# Patient Record
Sex: Female | Born: 1955 | Race: White | Hispanic: No | Marital: Married | Smoking: Never smoker
Health system: Southern US, Community
[De-identification: ages and names within clinical notes are randomized; demographics above are authoritative.]

## PROBLEM LIST (undated history)

## (undated) DIAGNOSIS — R5383 Other fatigue: Secondary | ICD-10-CM

## (undated) HISTORY — DX: Other fatigue: R53.83

---

## 2012-10-12 DIAGNOSIS — Z87898 Personal history of other specified conditions: Secondary | ICD-10-CM | POA: Insufficient documentation

## 2014-09-11 ENCOUNTER — Ambulatory Visit (HOSPITAL_COMMUNITY): Payer: BLUE CROSS/BLUE SHIELD | Admitting: Psychiatry

## 2014-11-28 ENCOUNTER — Encounter (HOSPITAL_COMMUNITY): Payer: Self-pay | Admitting: Psychiatry

## 2014-11-28 ENCOUNTER — Ambulatory Visit (INDEPENDENT_AMBULATORY_CARE_PROVIDER_SITE_OTHER): Payer: BLUE CROSS/BLUE SHIELD | Admitting: Psychiatry

## 2014-11-28 VITALS — BP 110/66 | HR 66 | Ht 65.5 in | Wt 201.0 lb

## 2014-11-28 DIAGNOSIS — F411 Generalized anxiety disorder: Secondary | ICD-10-CM

## 2014-11-28 DIAGNOSIS — F331 Major depressive disorder, recurrent, moderate: Secondary | ICD-10-CM | POA: Diagnosis not present

## 2014-11-28 DIAGNOSIS — M797 Fibromyalgia: Secondary | ICD-10-CM | POA: Insufficient documentation

## 2014-11-28 DIAGNOSIS — M545 Low back pain, unspecified: Secondary | ICD-10-CM | POA: Insufficient documentation

## 2014-11-28 DIAGNOSIS — G4700 Insomnia, unspecified: Secondary | ICD-10-CM

## 2014-11-28 MED ORDER — ESCITALOPRAM OXALATE 10 MG PO TABS: 10.0000 mg | ORAL_TABLET | Freq: Every day | ORAL | Status: DC

## 2014-11-28 MED ORDER — TRAZODONE HCL 50 MG PO TABS: 50.0000 mg | ORAL_TABLET | Freq: Every day | ORAL | Status: DC

## 2014-11-28 NOTE — Progress Notes (Signed)
Psychiatric Initial Adult Assessment   Patient Identification: Danielle Boyd MRN:  [id number] Date of Evaluation:  11/28/2014 Referral Source: Self and Dr. Alton Revere (Neurology) Chief Complaint:   Chief Complaint    Establish Care     Visit Diagnosis:    ICD-9-CM ICD-10-CM   1. Fibromyalgia 729.1 M79.7   2. Moderate episode of recurrent major depressive disorder (HCC) 296.32 F33.1   3. GAD (generalized anxiety disorder) 300.02 F41.1   4. Insomnia 780.52 G47.00    Diagnosis:   Patient Active Problem List   Diagnosis Date Noted  . Fibromyalgia [M79.7] 11/28/2014  . Low back pain [M54.5] 11/28/2014  . 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  Patient says that she is suffering from depression for the last 6-7 years since her granddaughter was born. She has having difficulty in her developmental milestones. Patient says before that she has been doing reasonable and what take trips with her husband and be happy. Patient endorses decreased motivation decreased interest crying spells. Currently she is on disability because of her fibromyalgia. Patient also endorses excessive worry, unreasonable at times difficulty sleeping muscle tightening at night restless legs.  Aggravating factors; some developmental delays of her granddaughter. Medical complexity including fibromyalgia and restless legs. At times finances Modifying factors; her supportive husband and her daughter. Medical complexity; high blood pressure, chronic back pain. Fibromyalgia  Location; depression and anxiety, insomnia She is having difficulty sleeping maintaining sleep she goes to bed at 9 and has difficult falling asleep and her mind starts worrying. She has been on Ativan which has been gradually increased to 4 mg. She also takes melatonin at times she is  also on high dose of gabapentin and Inderal Severity; 4 out of 10. 10 being no depression Context; her granddaughter Duration; 7 years  (Hypo) Manic Symptoms:  Distractibility, Anxiety Symptoms:  Excessive Worry, Psychotic Symptoms:  denies PTSD Symptoms: NA   Past Psychiatric History: No prior psychiatric admissions. No prior psychiatric suicide attempt. She has not seen a psychiatrist. She has not been on an antidepressant. Since then years ago her dad died and she was put on some medication she said that she did not take it for long  Past Medical History: No past medical history on file. No past surgical history on file. Family History: No family history on file. Social History:   Social History   Social History  . Marital Status: Married    Spouse Name: N/A  . Number of Children: N/A  . Years of Education: N/A   Social History Main Topics  . Smoking status: Not on file  . Smokeless tobacco: Not on file  . Alcohol Use: Not on file  . Drug Use: Not on file  . Sexual Activity: Not on file   Other Topics Concern  . Not on file   Social History Narrative  . No narrative on file   Additional Social History: She grew up with her parents. Says growing up was good no trauma. She had a good childhood. She has worked in office settings for many years currently she is on disability because of fibromyalgia. She is married has a supportive husband she has one grown daughter. No legal issue  Musculoskeletal: Strength & Muscle Tone: within normal limits Gait & Station: normal Patient leans: N/A  Psychiatric Specialty Exam: HPI  Review of Systems  Constitutional:  Positive for malaise/fatigue. Negative for fever.  Cardiovascular: Negative for chest pain and palpitations.  Musculoskeletal: Positive for myalgias.  Neurological: Negative for tremors.  Psychiatric/Behavioral: Positive for depression. Negative for suicidal ideas and substance abuse. The patient is nervous/anxious and  has insomnia.     Blood pressure 110/66, pulse 66, height 5' 5.5" (1.664 m), weight 201 lb (91.173 kg), SpO2 94 %.Body mass index is 32.93 kg/(m^2).  General Appearance: Casual  Eye Contact:  Fair  Speech:  Slow  Volume:  Decreased  Mood:  Dysphoric  Affect:  Congruent, Constricted and Depressed  Thought Process:  Coherent  Orientation:  Full (Time, Place, and Person)  Thought Content:  Rumination  Suicidal Thoughts:  No  Homicidal Thoughts:  No  Memory:  Immediate;   Fair Recent;   Fair  Judgement:  Fair  Insight:  Shallow  Psychomotor Activity:  Decreased  Concentration:  Fair  Recall:  [employer]  Fund of Knowledge:Fair  Language: Fair  Akathisia:  Negative  Handed:  Right  AIMS (if indicated):    Assets:  Desire for Improvement  ADL's:  Intact  Cognition: WNL  Sleep:  poor   Is the patient at risk to self?  No. Has the patient been a risk to self in the past 6 months?  No. Has the patient been a risk to self within the distant past?  No. Is the patient a risk to others?  No. Has the patient been a risk to others in the past 6 months?  No. Has the patient been a risk to others within the distant past?  No.  Allergies:   Allergies  Allergen Reactions  . Nsaids     Other reaction(s): Other (See Comments) GI upset   Current Medications: Current Outpatient Prescriptions  Medication Sig Dispense Refill  . escitalopram (LEXAPRO) 10 MG tablet Take 1 tablet (10 mg total) by mouth daily. Take half tablet for first 5 days and then one tablet a day. 30 tablet 0  . gabapentin (NEURONTIN) 400 MG capsule TAKE ONE CAPSULE BY MOUTH IN THE MORNING, AND 3 OR 4 AT BEDTIME  6  . HYDROcodone-acetaminophen (NORCO) 10-325 MG tablet     . LORazepam (ATIVAN) 2 MG tablet Frequency:   Dosage:0.0     Instructions:  Note:    . LORazepam (ATIVAN) 2 MG tablet TAKE 1 TO 2 TABLETS AT BEDTIME  2  . MIMVEY 1-0.5 MG tablet Take 1 tablet by mouth daily.  11  . NEUPRO 2 MG/24HR APPLY 1 PATCH DAILY AS  DIRECTED.  4  . ondansetron (ZOFRAN) 4 MG tablet TAKE 1 TABLET EVERY 8 HOURS AS NEEDED FOR NAUSEA  0  . propranolol ER (INDERAL LA) 160 MG SR capsule Take 160 mg by mouth daily.  5  . simvastatin (ZOCOR) 20 MG tablet Take 20 mg by mouth daily.  3  . traZODone (DESYREL) 50 MG tablet Take 1 tablet (50 mg total) by mouth at bedtime. 30 tablet 0   No current facility-administered medications for this visit.    Previous Psychotropic Medications: No   Substance Abuse History in the last 12 months:  No.  Consequences of Substance Abuse: NA  Medical Decision Making:  Review of Psycho-Social Stressors (1), Decision to obtain old records (1), Review of Medication Regimen & Side Effects (2) and Review of New Medication or Change in Dosage (2)  Treatment Plan Summary: Medication management and Plan as follows   MDD: Start Lexapro 5 mg increase to 10 mg in  one week,. Recommend to cut down inderal LA for headaches. Generalized anxiety disorder; start Lexapro as above Insomnia; may be related with high doses of Ativan and other pain medications. She will have to start working slowly on cutting down melatonin, cut down Ativan. Discussed and reviewed sleep hygiene Medical complexity; including fibromyalgia. She will work with her primary care physician and neurologist to work down on lowering the medications which may be sedating or if contributing to depression. She understands medication to take time and will need adjustment from our side and patients sleep habits. She is also advised to avoid any alcohol. Recommend psychotherapy. More than 50% time spent in counseling and coordination came to inpatient education, sleep review patient education and review of side effects Call 911 or report local emergency room for any urgent concerns of suicidal thoughts  Follow-up in 3-4 weeks or earlier if needed  Jeorgia Helming 11/15/20169:43 AM

## 2014-11-28 NOTE — Patient Instructions (Signed)
Lower and consider to DC ativan at night Lower the dose of inderal as it can cause depression Lower neuroniting or any sedating medications slowly  Avoid caffeine Go to bed late around 10;30 pm and avoid naps or recliner during evening. I will start trazadone at night and lexapro for depression and anxiety

## 2014-12-04 ENCOUNTER — Telehealth (HOSPITAL_COMMUNITY): Payer: Self-pay | Admitting: *Deleted

## 2014-12-04 NOTE — Telephone Encounter (Signed)
Pt would like to speak with Dr. Gilmore LarocheAkhtar. Pt states the medication is too strong for her. Pt states she is having walking since she started taking Lexapro and Trazodone. Please call to advise at [phone].

## 2014-12-04 NOTE — Telephone Encounter (Signed)
Per Dr. Gilmore LarocheAkhtar, please inform pt to decrease dosage of Lexapro from 10mg  to 5mg  and Trazodone from 50mg  to 25mg . If pt is still experiencing symptoms, please contact the office for an earlier appt. Pt verbalizes understanding.

## 2014-12-11 NOTE — Telephone Encounter (Signed)
Return telephone call to pt. Pt states she is having body aches and stop taking the medication. Pt states she would like to try a different medication. Informed pt medication changes are not done over the telephone. Offered to schedule an appt, pt is schedule for a visit on 12/14/14.

## 2014-12-11 NOTE — Telephone Encounter (Signed)
[name] [phone]  Danielle Boyd called to say that she had quit taking her medicine and she cry ed all day Saturday and she needs to come in or something please call her to advise.

## 2014-12-14 ENCOUNTER — Ambulatory Visit (INDEPENDENT_AMBULATORY_CARE_PROVIDER_SITE_OTHER): Payer: BLUE CROSS/BLUE SHIELD | Admitting: Psychiatry

## 2014-12-14 ENCOUNTER — Encounter (HOSPITAL_COMMUNITY): Payer: Self-pay | Admitting: Psychiatry

## 2014-12-14 VITALS — BP 114/62 | HR 62 | Ht 65.5 in | Wt 199.0 lb

## 2014-12-14 DIAGNOSIS — F331 Major depressive disorder, recurrent, moderate: Secondary | ICD-10-CM | POA: Diagnosis not present

## 2014-12-14 DIAGNOSIS — G4700 Insomnia, unspecified: Secondary | ICD-10-CM | POA: Diagnosis not present

## 2014-12-14 DIAGNOSIS — F411 Generalized anxiety disorder: Secondary | ICD-10-CM

## 2014-12-14 DIAGNOSIS — M797 Fibromyalgia: Secondary | ICD-10-CM

## 2014-12-14 MED ORDER — BUPROPION HCL 75 MG PO TABS: 75.0000 mg | ORAL_TABLET | Freq: Every morning | ORAL | Status: DC

## 2014-12-14 NOTE — Progress Notes (Signed)
Patient ID: Danielle Boyd, female   DOB: 01-Jul-1955, 59 y.o.   MRN: [id number]  Psychiatric [facility] Outpatient Visit  Patient Identification: Danielle Boyd MRN:  [id number] Date of Evaluation:  12/14/2014 Referral Source: Self and Dr. Alton Revere (Neurology) Chief Complaint:   Chief Complaint    Medication Problem     Visit Diagnosis:    ICD-9-CM ICD-10-CM   1. Fibromyalgia 729.1 M79.7   2. Moderate episode of recurrent major depressive disorder (HCC) 296.32 F33.1   3. GAD (generalized anxiety disorder) 300.02 F41.1   4. Insomnia 780.52 G47.00    Diagnosis:   Patient Active Problem List   Diagnosis Date Noted  . Fibromyalgia [M79.7] 11/28/2014  . Low back pain [M54.5] 11/28/2014  . 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  Initially presented with depression. Crying spells and thinking about her granddaughter. Last visit we added Lexapro. She had some aches and pain or radiation of pain and she stopped it. Continues to endorse anxiety complicated by having difficulty sleeping high tolerance of pain medication and fibromyalgia  Aggravating factors; some developmental delays of her granddaughter. Medical complexity including fibromyalgia and restless legs. At times finances Modifying factors; her supportive husband and her daughter. Medical complexity; high blood pressure, chronic back pain. Fibromyalgia  Location; depression and anxiety, insomnia She is having difficulty sleeping maintaining sleep she goes to bed at 9 and has difficult falling asleep and her mind starts worrying. She has been on Ativan which has been gradually increased to 4 mg. She also takes melatonin at times she is also on high dose of gabapentin and Inderal Severity; 4 out of 10. 10 being no depression Context; her granddaughter Duration; 7  years  (Hypo) Manic Symptoms:  Distractibility, Anxiety Symptoms:  Excessive Worry, Psychotic Symptoms:  denies PTSD Symptoms: NA     Past Medical History:  Past Medical History  Diagnosis Date  . Fatigue    No past surgical history on file. Family History: No family history on file. Social History:   Social History   Social History  . Marital Status: Married    Spouse Name: N/A  . Number of Children: N/A  . Years of Education: N/A   Social History Main Topics  . Smoking status: Never Smoker   . Smokeless tobacco: Never Used  . Alcohol Use: 0.6 oz/week    1 Cans of beer per week  . Drug Use: No  . Sexual Activity: Not Currently   Other Topics Concern  . None   Social History Narrative     Musculoskeletal: Strength & Muscle Tone: within normal limits Gait & Station: normal Patient leans: N/A  Psychiatric Specialty Exam: HPI  Review of Systems  Constitutional: Positive for malaise/fatigue. Negative for fever.  Cardiovascular: Negative for chest pain and palpitations.  Musculoskeletal: Positive for myalgias.  Skin: Negative for rash.  Neurological: Negative for tremors.  Psychiatric/Behavioral: Positive for depression. Negative for suicidal ideas and substance abuse. The patient is nervous/anxious and has insomnia.     Blood pressure 114/62, pulse 62, height 5' 5.5" (1.664 m), weight 199 lb (90.266 kg), SpO2 94 %.Body mass index is 32.6 kg/(m^2).  General Appearance: Casual  Eye Contact:  Fair  Speech:  Slow  Volume:  Decreased  Mood:  Dysphoric  Affect:  Congruent, Constricted and Depressed  Thought Process:  Coherent  Orientation:  Full (Time, Place,  and Person)  Thought Content:  Rumination  Suicidal Thoughts:  No  Homicidal Thoughts:  No  Memory:  Immediate;   Fair Recent;   Fair  Judgement:  Fair  Insight:  Shallow  Psychomotor Activity:  Decreased  Concentration:  Fair  Recall:  [employer] of Knowledge:Fair  Language: Fair  Akathisia:   Negative  Handed:  Right  AIMS (if indicated):    Assets:  Desire for Improvement  ADL's:  Intact  Cognition: WNL  Sleep:  poor   Is the patient at risk to self?  No. Has the patient been a risk to self in the past 6 months?  No. Has the patient been a risk to self within the distant past?  No.   Allergies:   Allergies  Allergen Reactions  . Nsaids     Other reaction(s): Other (See Comments) GI upset   Current Medications: Current Outpatient Prescriptions  Medication Sig Dispense Refill  . gabapentin (NEURONTIN) 400 MG capsule TAKE ONE CAPSULE BY MOUTH IN THE MORNING, AND 3 OR 4 AT BEDTIME  6  . HYDROcodone-acetaminophen (NORCO) 10-325 MG tablet     . LORazepam (ATIVAN) 2 MG tablet TAKE 1 TO 2 TABLETS AT BEDTIME  2  . MIMVEY 1-0.5 MG tablet Take 1 tablet by mouth daily.  11  . NEUPRO 2 MG/24HR APPLY 1 PATCH DAILY AS DIRECTED.  4  . propranolol ER (INDERAL LA) 160 MG SR capsule Take 160 mg by mouth daily.  5  . simvastatin (ZOCOR) 20 MG tablet Take 20 mg by mouth daily.  3  . buPROPion (WELLBUTRIN) 75 MG tablet Take 1 tablet (75 mg total) by mouth every morning. 30 tablet 1  . escitalopram (LEXAPRO) 10 MG tablet Take 1 tablet (10 mg total) by mouth daily. Take half tablet for first 5 days and then one tablet a day. (Patient not taking: Reported on 12/14/2014) 30 tablet 0  . traZODone (DESYREL) 50 MG tablet Take 1 tablet (50 mg total) by mouth at bedtime. (Patient not taking: Reported on 12/14/2014) 30 tablet 0   No current facility-administered medications for this visit.    Previous Psychotropic Medications: No   Substance Abuse History in the last 12 months:  No.  Consequences of Substance Abuse: NA  Medical Decision Making:  Review of Psycho-Social Stressors (1), Decision to obtain old records (1), Review of Medication Regimen & Side Effects (2) and Review of New Medication or Change in Dosage (2)  Treatment Plan Summary: Medication management and Plan as follows    MDD: Stop lexapro. Start wellbutrin small dose of  if she can tolerate for depression. Recommend to cut down inderal LA for headaches. Generalized anxiety disorder; therapy and coping skills for now. She is also on ativan from primary care Insomnia; may be related with high doses of Ativan and other pain medications. She will have to start working slowly on cutting down melatonin, cut down Ativan. Discussed and reviewed sleep hygiene Medical complexity; including fibromyalgia. She will work with her primary care physician and neurologist to work down on lowering the medications which may be sedating or if contributing to depression. She understands medication to take time and will need adjustment from our side and patients sleep habits. She is also advised to avoid any alcohol. Recommend psychotherapy. More than 50% time spent in counseling and coordination came to inpatient education, sleep review patient education and review of side effects Call 911 or report local emergency room for any urgent concerns of  suicidal thoughts  Follow-up in 4-6 weeks or earlier if needed   Seham Gardenhire 12/1/201610:24 AM

## 2014-12-24 ENCOUNTER — Other Ambulatory Visit (HOSPITAL_COMMUNITY): Payer: Self-pay | Admitting: Psychiatry

## 2014-12-25 NOTE — Telephone Encounter (Signed)
Received medication request from [facility] for Lexapro 10mg  and Trazodone 50mg . Per Dr. Gilmore LarocheAkhtar, medication request is denied. Pt is schedule for a f/u appt on 12/27/14.

## 2014-12-27 ENCOUNTER — Ambulatory Visit (HOSPITAL_COMMUNITY): Payer: Medicare Other | Admitting: Psychiatry

## 2014-12-28 ENCOUNTER — Telehealth (HOSPITAL_COMMUNITY): Payer: Self-pay | Admitting: *Deleted

## 2014-12-28 NOTE — Telephone Encounter (Signed)
Pt would like to speak about her medication. Pt states since she start Wellbutrin, she is feeling more depressed every other day. Please call to advise at [phone].

## 2014-12-28 NOTE — Telephone Encounter (Signed)
Call went to voice mail. Left message to call back.  When call back would recommend increase wellbutrin to 2 tablets and see its effect in few days. If suicidal or worsened need to go local ED or call 911.  Call back again after increase dose in few days

## 2014-12-28 NOTE — Telephone Encounter (Signed)
Patient returned call. Advised to lower abilify 5mg  and see it effects. Also can change lamictal back to 1bid instead of 2 at night.

## 2015-01-25 ENCOUNTER — Ambulatory Visit (HOSPITAL_COMMUNITY): Payer: BLUE CROSS/BLUE SHIELD | Admitting: Psychiatry

## 2015-01-26 ENCOUNTER — Ambulatory Visit (INDEPENDENT_AMBULATORY_CARE_PROVIDER_SITE_OTHER): Payer: BLUE CROSS/BLUE SHIELD | Admitting: Psychiatry

## 2015-01-26 ENCOUNTER — Encounter (HOSPITAL_COMMUNITY): Payer: Self-pay | Admitting: Psychiatry

## 2015-01-26 VITALS — BP 110/62 | HR 67 | Ht 65.5 in | Wt 199.0 lb

## 2015-01-26 DIAGNOSIS — G4700 Insomnia, unspecified: Secondary | ICD-10-CM

## 2015-01-26 DIAGNOSIS — F331 Major depressive disorder, recurrent, moderate: Secondary | ICD-10-CM | POA: Diagnosis not present

## 2015-01-26 DIAGNOSIS — F411 Generalized anxiety disorder: Secondary | ICD-10-CM

## 2015-01-26 MED ORDER — PAROXETINE HCL 20 MG PO TABS: 20.0000 mg | ORAL_TABLET | Freq: Every day | ORAL | Status: DC

## 2015-01-26 NOTE — Progress Notes (Signed)
Patient ID: Danielle Boyd, female   DOB: 06-27-1955, 60 y.o.   MRN: [id number]  Psychiatric [facility] Outpatient Visit  Patient Identification: Danielle Boyd MRN:  [id number] Date of Evaluation:  01/26/2015 Referral Source: Self and Dr. Alton Revere (Neurology) Chief Complaint:   Chief Complaint    Follow-up     Visit Diagnosis:    ICD-9-CM ICD-10-CM   1. Moderate episode of recurrent major depressive disorder (HCC) 296.32 F33.1   2. GAD (generalized anxiety disorder) 300.02 F41.1   3. Insomnia 780.52 G47.00    Diagnosis:   Patient Active Problem List   Diagnosis Date Noted  . Fibromyalgia [M79.7] 11/28/2014  . Low back pain [M54.5] 11/28/2014  . 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  Initially presented with depression. Crying spells and thinking about her granddaughter. Last visit we added Lexapro. She had some aches and pain or radiation of pain and she stopped it. Endorsed anxiety complicated by having difficulty sleeping high tolerance of pain medication and fibromyalgia.  Last visit he was started on Wellbutrin she did not tolerate felt more agitated. She is not taking trazodone, Lexapro She continues to worry about her granddaughter that's a significant factor she cannot sleep without Ativan since that she has been on Ativan for a long time. Anxiety worsened.  Aggravating factors; some developmental delays of her granddaughter. Medical complexity including fibromyalgia and restless legs. At times finances Modifying factors; her supportive husband and her daughter. Medical complexity; high blood pressure, chronic back pain. Fibromyalgia  Location; depression and anxiety, insomnia She is having difficulty sleeping maintaining sleep she goes to bed at 9 and has difficult falling asleep and her mind starts worrying. She  has been on Ativan which has been gradually increased to 4 mg. She also takes melatonin at times she is also on high dose of gabapentin and Inderal Severity; 4 out of 10. 10 being no depression Context; her granddaughter Duration; 7 years  (Hypo) Manic Symptoms:  Distractibility, Anxiety Symptoms:  Excessive Worry, Psychotic Symptoms:  denies PTSD Symptoms: NA     Past Medical History:  Past Medical History  Diagnosis Date  . Fatigue    History reviewed. No pertinent past surgical history. Family History: History reviewed. No pertinent family history. Social History:   Social History   Social History  . Marital Status: Married    Spouse Name: N/A  . Number of Children: N/A  . Years of Education: N/A   Social History Main Topics  . Smoking status: Never Smoker   . Smokeless tobacco: Never Used  . Alcohol Use: 0.6 oz/week    1 Cans of beer per week  . Drug Use: No  . Sexual Activity: Not Currently   Other Topics Concern  . None   Social History Narrative     Musculoskeletal: Strength & Muscle Tone: within normal limits Gait & Station: normal Patient leans: N/A  Psychiatric Specialty Exam: HPI  Review of Systems  Constitutional: Positive for malaise/fatigue. Negative for fever.  Cardiovascular: Negative for chest pain and palpitations.  Musculoskeletal: Positive for myalgias.  Skin: Negative for rash.  Neurological: Negative for tingling and tremors.  Psychiatric/Behavioral: Positive for depression. Negative for suicidal ideas and substance abuse. The patient is nervous/anxious and has insomnia.     Blood pressure 110/62, pulse 67, height 5' 5.5" (1.664 m), weight 199 lb (90.266 kg), SpO2 96 %.  Body mass index is 32.6 kg/(m^2).  General Appearance: Casual  Eye Contact:  Fair  Speech:  Slow  Volume:  Decreased  Mood:  Dysphoric  Affect:  Congruent, Constricted and Depressed  Thought Process:  Coherent  Orientation:  Full (Time, Place, and Person)  Thought  Content:  Rumination  Suicidal Thoughts:  No  Homicidal Thoughts:  No  Memory:  Immediate;   Fair Recent;   Fair  Judgement:  Fair  Insight:  Shallow  Psychomotor Activity:  Decreased  Concentration:  Fair  Recall:  [employer]  Fund of Knowledge:Fair  Language: Fair  Akathisia:  Negative  Handed:  Right  AIMS (if indicated):    Assets:  Desire for Improvement  ADL's:  Intact  Cognition: WNL  Sleep:  poor   Is the patient at risk to self?  No. Has the patient been a risk to self in the past 6 months?  No. Has the patient been a risk to self within the distant past?  No.   Allergies:   Allergies  Allergen Reactions  . Nsaids     Other reaction(s): Other (See Comments) GI upset   Current Medications: Current Outpatient Prescriptions  Medication Sig Dispense Refill  . gabapentin (NEURONTIN) 400 MG capsule TAKE ONE CAPSULE BY MOUTH IN THE MORNING, AND 3 OR 4 AT BEDTIME  6  . HYDROcodone-acetaminophen (NORCO) 10-325 MG tablet     . MIMVEY 1-0.5 MG tablet Take 1 tablet by mouth daily.  11  . NEUPRO 2 MG/24HR APPLY 1 PATCH DAILY AS DIRECTED.  4  . propranolol ER (INDERAL LA) 160 MG SR capsule Take 160 mg by mouth daily.  5  . simvastatin (ZOCOR) 20 MG tablet Take 20 mg by mouth daily.  3  . LORazepam (ATIVAN) 2 MG tablet TAKE 1 TO 2 TABLETS AT BEDTIME  2  . PARoxetine (PAXIL) 20 MG tablet Take 1 tablet (20 mg total) by mouth daily. Start half tablet for first 7 days then start taking one a day. 30 tablet 1  . [DISCONTINUED] buPROPion (WELLBUTRIN) 75 MG tablet Take 1 tablet (75 mg total) by mouth every morning. (Patient not taking: Reported on 01/26/2015) 30 tablet 1  . [DISCONTINUED] escitalopram (LEXAPRO) 10 MG tablet Take 1 tablet (10 mg total) by mouth daily. Take half tablet for first 5 days and then one tablet a day. (Patient not taking: Reported on 12/14/2014) 30 tablet 0  . [DISCONTINUED] traZODone (DESYREL) 50 MG tablet Take 1 tablet (50 mg total) by mouth at bedtime. (Patient  not taking: Reported on 12/14/2014) 30 tablet 0   No current facility-administered medications for this visit.    Previous Psychotropic Medications: No   Substance Abuse History in the last 12 months:  No.  Consequences of Substance Abuse: NA  Medical Decision Making:  Review of Psycho-Social Stressors (1), Decision to obtain old records (1), Review of Medication Regimen & Side Effects (2) and Review of New Medication or Change in Dosage (2)  Treatment Plan Summary: Medication management and Plan as follows   MDD: She wants to consider Paxil. Start 10 mg increased to 20 mg for depression, anxiety. Generalized anxiety disorder; therapy and coping skills for now. She is also on ativan from primary care. Start paxil today Insomnia; may be related with high doses of Ativan and other pain medications. She will have to start working slowly on cutting down melatonin, cut down Ativan. Discussed and reviewed sleep hygiene Medical complexity; including fibromyalgia. She will work with her  primary care physician and neurologist to work down on lowering the medications which may be sedating or if contributing to depression. She understands medication to take time and will need adjustment from our side and patients sleep habits. She is also advised to avoid any alcohol. Recommend psychotherapy. More than 50% time spent in counseling and coordination came to inpatient education, sleep review patient education and review of side effects Call 911 or report local emergency room for any urgent concerns of suicidal thoughts  Follow-up in 4-6 weeks or earlier if needed   Paizlee Kinder 1/13/20179:34 AM

## 2015-03-06 DIAGNOSIS — G2581 Restless legs syndrome: Secondary | ICD-10-CM | POA: Insufficient documentation

## 2015-03-06 DIAGNOSIS — I10 Essential (primary) hypertension: Secondary | ICD-10-CM | POA: Insufficient documentation

## 2015-03-06 DIAGNOSIS — R202 Paresthesia of skin: Secondary | ICD-10-CM | POA: Insufficient documentation

## 2015-03-06 DIAGNOSIS — E785 Hyperlipidemia, unspecified: Secondary | ICD-10-CM | POA: Insufficient documentation

## 2015-03-06 DIAGNOSIS — G4700 Insomnia, unspecified: Secondary | ICD-10-CM | POA: Insufficient documentation

## 2015-03-06 DIAGNOSIS — F329 Major depressive disorder, single episode, unspecified: Secondary | ICD-10-CM | POA: Insufficient documentation

## 2015-03-06 DIAGNOSIS — K589 Irritable bowel syndrome without diarrhea: Secondary | ICD-10-CM | POA: Insufficient documentation

## 2015-03-06 DIAGNOSIS — R0683 Snoring: Secondary | ICD-10-CM | POA: Insufficient documentation

## 2015-03-06 DIAGNOSIS — R5383 Other fatigue: Secondary | ICD-10-CM | POA: Insufficient documentation

## 2015-03-06 DIAGNOSIS — G43009 Migraine without aura, not intractable, without status migrainosus: Secondary | ICD-10-CM | POA: Insufficient documentation

## 2015-03-06 DIAGNOSIS — G250 Essential tremor: Secondary | ICD-10-CM | POA: Insufficient documentation

## 2015-03-06 DIAGNOSIS — F32A Depression, unspecified: Secondary | ICD-10-CM | POA: Insufficient documentation

## 2015-03-06 DIAGNOSIS — M79606 Pain in leg, unspecified: Secondary | ICD-10-CM | POA: Insufficient documentation

## 2015-03-08 DIAGNOSIS — M5412 Radiculopathy, cervical region: Secondary | ICD-10-CM | POA: Insufficient documentation

## 2015-03-19 DIAGNOSIS — M5020 Other cervical disc displacement, unspecified cervical region: Secondary | ICD-10-CM | POA: Insufficient documentation

## 2015-03-23 ENCOUNTER — Encounter (HOSPITAL_COMMUNITY): Payer: Self-pay | Admitting: Psychiatry

## 2015-03-23 ENCOUNTER — Ambulatory Visit (INDEPENDENT_AMBULATORY_CARE_PROVIDER_SITE_OTHER): Payer: Medicare Other | Admitting: Psychiatry

## 2015-03-23 DIAGNOSIS — G4700 Insomnia, unspecified: Secondary | ICD-10-CM

## 2015-03-23 DIAGNOSIS — F411 Generalized anxiety disorder: Secondary | ICD-10-CM

## 2015-03-23 DIAGNOSIS — F331 Major depressive disorder, recurrent, moderate: Secondary | ICD-10-CM | POA: Diagnosis not present

## 2015-03-23 DIAGNOSIS — M797 Fibromyalgia: Secondary | ICD-10-CM

## 2015-03-23 MED ORDER — PAROXETINE HCL 20 MG PO TABS: 20.0000 mg | ORAL_TABLET | Freq: Every day | ORAL | Status: DC

## 2015-03-23 NOTE — Progress Notes (Signed)
Patient ID: Danielle Boyd, female   DOB: Jul 25, 1955, 60 y.o.   MRN: [id number]  [facility]  Patient Identification: Danielle Boyd MRN:  [id number] Date of Evaluation:  03/23/2015 Referral Source: Self and Dr. Alton RevereFeraru (Neurology) Chief Complaint:   Chief Complaint    Follow-up     Visit Diagnosis:    ICD-9-CM ICD-10-CM   1. Moderate episode of recurrent major depressive disorder (HCC) 296.32 F33.1   2. GAD (generalized anxiety disorder) 300.02 F41.1   3. Insomnia 780.52 G47.00   4. Fibromyalgia 729.1 M79.7    Diagnosis:   Patient Active Problem List   Diagnosis Date Noted  . Fibromyalgia [M79.7] 11/28/2014  . Low back pain [M54.5] 11/28/2014  . 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  Initially presented with depression. Crying spells and thinking about her granddaughter. Last visit we added Lexapro. She had some aches and pain or radiation of pain and she stopped it. Endorsed anxiety complicated by having difficulty sleeping high tolerance of pain medication and fibromyalgia.  Last visit she continued to have some shoulder pain and wants to stop Lexapro. Apparently restarted Paxil but she found out by visiting her doctor that it is related with her back condition and she may need an epidural injection which is causing the shoulder pains. Paxil is helping her depression certain extent. Her Vicodin was increased for tablets she was taking Ativan I cautioned about long-term concerns on being on these medications. Anxiety fluctuates. Takes ativan. Also on paxil  Aggravating factors; some developmental delays of her granddaughter. Medical complexity including fibromyalgia and restless legs. At times finances Modifying factors; her supportive husband and her daughter. Medical complexity; high  blood pressure, chronic back pain. Fibromyalgia  Location; depression and anxiety, insomnia She is having difficulty sleeping maintaining sleep she goes to bed at 9 and has difficult falling asleep and her mind starts worrying. She has been on Ativan which has been gradually increased to 4 mg. She also takes melatonin at times she is also on high dose of gabapentin and Inderal Severity; 5 out of 10. 10 being no depression Context; her granddaughter. Shoulder pain and concern of back surgery or epidural Duration; 7 years  (Hypo) Manic Symptoms:  Distractibility, Anxiety Symptoms:  Excessive Worry, Psychotic Symptoms:  denies PTSD Symptoms: NA     Past Medical History:  Past Medical History  Diagnosis Date  . Fatigue    History reviewed. No pertinent past surgical history. Family History: History reviewed. No pertinent family history. Social History:   Social History   Social History  . Marital Status: Married    Spouse Name: N/A  . Number of Children: N/A  . Years of Education: N/A   Social History Main Topics  . Smoking status: Never Smoker   . Smokeless tobacco: Never Used  . Alcohol Use: 0.6 oz/week    1 Cans of beer per week  . Drug Use: No  . Sexual Activity: Not Currently   Other Topics Concern  . None   Social History Narrative     Musculoskeletal: Strength & Muscle Tone: within normal limits Gait & Station: normal Patient leans: N/A  Psychiatric Specialty Exam: HPI  Review of Systems  Constitutional: Positive for malaise/fatigue. Negative for fever.  Cardiovascular: Negative for chest pain and palpitations.  Musculoskeletal: Positive for myalgias.  Skin: Negative for rash.  Neurological: Negative for tingling and tremors.  Psychiatric/Behavioral: Negative for suicidal ideas and substance abuse. The patient is nervous/anxious.     There were no vitals taken for this visit.There is no weight on file to calculate BMI.  General Appearance: Casual  Eye  Contact:  Fair  Speech:  Slow  Volume:  Decreased  Mood:  Less dysphoric  Affect:  constricted  Thought Process:  Coherent  Orientation:  Full (Time, Place, and Person)  Thought Content:  Rumination  Suicidal Thoughts:  No  Homicidal Thoughts:  No  Memory:  Immediate;   Fair Recent;   Fair  Judgement:  Fair  Insight:  Shallow  Psychomotor Activity:  Decreased  Concentration:  Fair  Recall:  [employer] of Knowledge:Fair  Language: Fair  Akathisia:  Negative  Handed:  Right  AIMS (if indicated):    Assets:  Desire for Improvement  ADL's:  Intact  Cognition: WNL  Sleep:  poor   Is the patient at risk to self?  No. Has the patient been a risk to self in the past 6 months?  No. Has the patient been a risk to self within the distant past?  No.   Allergies:   Allergies  Allergen Reactions  . Nsaids     Other reaction(s): Other (See Comments) GI upset   Current Medications: Current Outpatient Prescriptions  Medication Sig Dispense Refill  . gabapentin (NEURONTIN) 400 MG capsule TAKE ONE CAPSULE BY MOUTH IN THE MORNING, AND 3 OR 4 AT BEDTIME  6  . HYDROcodone-acetaminophen (NORCO) 10-325 MG tablet     . LORazepam (ATIVAN) 2 MG tablet TAKE 1 TO 2 TABLETS AT BEDTIME  2  . MIMVEY 1-0.5 MG tablet Take 1 tablet by mouth daily.  11  . NEUPRO 2 MG/24HR APPLY 1 PATCH DAILY AS DIRECTED.  4  . PARoxetine (PAXIL) 20 MG tablet Take 1 tablet (20 mg total) by mouth daily. take one a day. 30 tablet 1  . propranolol ER (INDERAL LA) 160 MG SR capsule Take 160 mg by mouth daily.  5  . simvastatin (ZOCOR) 20 MG tablet Take 20 mg by mouth daily.  3  . [DISCONTINUED] buPROPion (WELLBUTRIN) 75 MG tablet Take 1 tablet (75 mg total) by mouth every morning. (Patient not taking: Reported on 01/26/2015) 30 tablet 1  . [DISCONTINUED] escitalopram (LEXAPRO) 10 MG tablet Take 1 tablet (10 mg total) by mouth daily. Take half tablet for first 5 days and then one tablet a day. (Patient not taking: Reported  on 12/14/2014) 30 tablet 0  . [DISCONTINUED] traZODone (DESYREL) 50 MG tablet Take 1 tablet (50 mg total) by mouth at bedtime. (Patient not taking: Reported on 12/14/2014) 30 tablet 0   No current facility-administered medications for this visit.    Previous Psychotropic Medications: No   Substance Abuse History in the last 12 months:  No.  Consequences of Substance Abuse: NA   Treatment Plan Summary: Medication management and Plan as follows   MDD: contijnue paxil  it has helped Generalized anxiety disorder; paxil. Also on ativan Insomnia; may be related with high doses of Ativan and other pain medications. She will have to start working slowly on cutting down melatonin, cut down Ativan. Discussed and reviewed sleep hygiene Still remains reluctant to cut down on ativan for now Medical complexity; including fibromyalgia. She will work with her primary care physician and neurologist . Pending epidural for back concerns.  She understands medication to take time and will need adjustment from  our side and patients sleep habits. She is also advised to avoid any alcohol. Recommend psychotherapy. More than 50% time spent in counseling and coordination came to inpatient education, sleep review patient education and review of side effects Call 911 or report local emergency room for any urgent concerns of suicidal thoughts Time spent: 25 minutes Follow-up in 4-6 weeks or earlier if needed   Fabio Wah 3/10/20179:19 AM

## 2015-03-24 ENCOUNTER — Other Ambulatory Visit (HOSPITAL_COMMUNITY): Payer: Self-pay | Admitting: Psychiatry

## 2015-03-26 NOTE — Telephone Encounter (Signed)
Received medication request from [facility] for Paxil 20mg . Per Dr. Gilmore LarocheAkhtar, medication is denied. Refills were sent to pharmacy on 03/23/15 for Paxil 20mg , #30. Pt is schedule for a f/u appt on 05/18/15.

## 2015-03-27 DIAGNOSIS — E669 Obesity, unspecified: Secondary | ICD-10-CM | POA: Insufficient documentation

## 2015-03-27 DIAGNOSIS — IMO0002 Reserved for concepts with insufficient information to code with codable children: Secondary | ICD-10-CM | POA: Insufficient documentation

## 2015-03-27 DIAGNOSIS — K219 Gastro-esophageal reflux disease without esophagitis: Secondary | ICD-10-CM | POA: Insufficient documentation

## 2015-03-27 DIAGNOSIS — R7303 Prediabetes: Secondary | ICD-10-CM | POA: Insufficient documentation

## 2015-04-04 DIAGNOSIS — I10 Essential (primary) hypertension: Secondary | ICD-10-CM | POA: Insufficient documentation

## 2015-05-14 ENCOUNTER — Other Ambulatory Visit: Payer: Self-pay | Admitting: Neurology

## 2015-05-14 DIAGNOSIS — M5412 Radiculopathy, cervical region: Secondary | ICD-10-CM

## 2015-05-18 ENCOUNTER — Ambulatory Visit (HOSPITAL_COMMUNITY): Payer: BLUE CROSS/BLUE SHIELD | Admitting: Psychiatry

## 2015-05-21 ENCOUNTER — Ambulatory Visit
Admission: RE | Admit: 2015-05-21 | Discharge: 2015-05-21 | Disposition: A | Discharge status: Home / Self Care | Payer: BLUE CROSS/BLUE SHIELD | Source: Ambulatory Visit | Attending: Neurology | Admitting: Neurology

## 2015-05-21 VITALS — BP 155/87 | HR 54

## 2015-05-21 DIAGNOSIS — M5412 Radiculopathy, cervical region: Secondary | ICD-10-CM

## 2015-05-21 MED ORDER — DIAZEPAM 5 MG PO TABS
10.0000 mg | ORAL_TABLET | Freq: Once | ORAL | Status: AC
  Administered 2015-05-21: 10 mg via ORAL

## 2015-05-21 MED ORDER — IOHEXOL 300 MG/ML  SOLN
1.0000 mL | Freq: Once | INTRAMUSCULAR | Status: AC | PRN
  Administered 2015-05-21: 1 mL via EPIDURAL

## 2015-05-21 MED ORDER — TRIAMCINOLONE ACETONIDE 40 MG/ML IJ SUSP (RADIOLOGY)
60.0000 mg | Freq: Once | INTRAMUSCULAR | Status: AC
  Administered 2015-05-21: 60 mg via EPIDURAL

## 2015-05-21 NOTE — Discharge Instructions (Signed)

## 2015-05-23 ENCOUNTER — Other Ambulatory Visit (HOSPITAL_COMMUNITY): Payer: Self-pay | Admitting: Psychiatry

## 2015-05-28 NOTE — Telephone Encounter (Signed)
Received medication request from [facility] for Paxil 20mg . Per Dr. Gilmore LarocheAkhtar, medication request is denied. Pt no show last appt. Pt is schedule for f/u appt on 05/30/15.

## 2015-05-30 ENCOUNTER — Encounter (HOSPITAL_COMMUNITY): Payer: Self-pay | Admitting: Psychiatry

## 2015-05-30 ENCOUNTER — Ambulatory Visit (INDEPENDENT_AMBULATORY_CARE_PROVIDER_SITE_OTHER): Payer: BLUE CROSS/BLUE SHIELD | Admitting: Psychiatry

## 2015-05-30 DIAGNOSIS — M797 Fibromyalgia: Secondary | ICD-10-CM

## 2015-05-30 DIAGNOSIS — F331 Major depressive disorder, recurrent, moderate: Secondary | ICD-10-CM

## 2015-05-30 DIAGNOSIS — G4700 Insomnia, unspecified: Secondary | ICD-10-CM | POA: Diagnosis not present

## 2015-05-30 DIAGNOSIS — F411 Generalized anxiety disorder: Secondary | ICD-10-CM

## 2015-05-30 MED ORDER — PAROXETINE HCL 20 MG PO TABS
20.0000 mg | ORAL_TABLET | Freq: Every day | ORAL | Status: DC
Start: 2015-05-30 — End: 2015-09-05

## 2015-05-30 NOTE — Progress Notes (Signed)
Patient ID: Starlina Lapre, female   DOB: August 28, 1955, 60 y.o.   MRN: [id number]  Psychiatric [facility] Outpatient Visit  Patient Identification: Danielle Boyd MRN:  [id number] Date of Evaluation:  05/30/2015 Referral Source: Self and Dr. Alton Revere (Neurology) Chief Complaint:   Chief Complaint    Follow-up     Visit Diagnosis:  No diagnosis found. Diagnosis:   Patient Active Problem List   Diagnosis Date Noted  . Benign hypertension [I10] 04/04/2015  . Adult BMI 30+ [E66.8] 03/27/2015  . Acid reflux [K21.9] 03/27/2015  . 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 Snores [R06.83] 03/06/2015  . Restless leg [G25.81] 03/06/2015  . Pins and needles sensation [R20.2] 03/06/2015  . Leg pain [M79.606] 03/06/2015  . Cannot sleep [G47.00] 03/06/2015  . 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 BP (high blood pressure) [I10] 03/06/2015  . HLD (hyperlipidemia) [E78.5] 03/06/2015  . Fatigue [R53.83] 03/06/2015  . Benign essential tremor [G25.0] 03/06/2015  . Clinical depression [F32.9] 03/06/2015  . 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 Leg paresthesia [R20.2] 03/06/2015  . Fibromyalgia [M79.7] 11/28/2014  . Low back pain [M54.5] 11/28/2014  . 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  Initially presented with depression. Crying spells and thinking about her granddaughter, difficulty sleeping high tolerance of pain medication and fibromyalgia.  Paxil is helping the depression. She still has some considerable sleep goes to bed late cannot sleep. She wakes up tired. We talked about possibly sleep apnea. She has got a pain injection that is helping her  back and shoulder. She continues to take Vicodin sometimes Vicodin keeps her more charged. Anxiety fluctuates. Takes ativan. Also on paxil  Aggravating factors; some developmental delays of her granddaughter. Medical complexity including fibromyalgia and restless legs. At times finances Modifying factors; her supportive husband and her daughter. Medical complexity; high blood pressure, chronic back pain. Fibromyalgia  Location; depression and anxiety, insomnia  Severity; 7 out of 10. 10 being no depression Context; her granddaughter. Shoulder pain and concern of back surgery or epidural Duration; 7 years  (Hypo) Manic Symptoms:  Distractibility, Anxiety Symptoms:  Excessive Worry,(better) Psychotic Symptoms:  denies PTSD Symptoms: NA     Past Medical History:  Past Medical History  Diagnosis Date  . Fatigue    History reviewed. No pertinent past surgical history. Family History: History reviewed. No pertinent family history. Social History:   Social History   Social History  . Marital Status: Married    Spouse Name: N/A  . Number of Children: N/A  . Years of Education: N/A   Social History Main Topics  . Smoking status: Never Smoker   . Smokeless tobacco: Never Used  . Alcohol Use: 0.6 oz/week    1 Cans of beer per week  . Drug Use: No  . Sexual Activity: Not Currently   Other Topics Concern  . None   Social History Narrative     Musculoskeletal: Strength & Muscle Tone: within normal limits Gait & Station: normal Patient leans: N/A  Psychiatric Specialty Exam: HPI  Review of Systems  Constitutional: Positive for malaise/fatigue. Negative for fever.  Cardiovascular: Negative for chest pain and palpitations.  Musculoskeletal: Positive for myalgias.  Skin: Negative for rash.  Neurological: Negative  for tingling and tremors.  Psychiatric/Behavioral: Negative for suicidal ideas and substance abuse. The patient has insomnia.     There were no vitals taken  for this visit.There is no weight on file to calculate BMI.  General Appearance: Casual  Eye Contact:  Fair  Speech:  Slow  Volume:  Decreased  Mood:  Less dysphoric  Affect:  constricted  Thought Process:  Coherent  Orientation:  Full (Time, Place, and Person)  Thought Content:  Rumination  Suicidal Thoughts:  No  Homicidal Thoughts:  No  Memory:  Immediate;   Fair Recent;   Fair  Judgement:  Fair  Insight:  Shallow  Psychomotor Activity:  Decreased  Concentration:  Fair  Recall:  [employer]  Fund of Knowledge:Fair  Language: Fair  Akathisia:  Negative  Handed:  Right  AIMS (if indicated):    Assets:  Desire for Improvement  ADL's:  Intact  Cognition: WNL  Sleep:  poor   Is the patient at risk to self?  No. Has the patient been a risk to self in the past 6 months?  No. Has the patient been a risk to self within the distant past?  No.   Allergies:   Allergies  Allergen Reactions  . Duloxetine Other (See Comments)    Abdominal pain  . Milnacipran Nausea Only  . Nsaids Nausea Only     GI upse  . Pramipexole Other (See Comments)    Insomnia  . Ropinirole Nausea Only  . Tolmetin Nausea Only        Current Medications: Current Outpatient Prescriptions  Medication Sig Dispense Refill  . gabapentin (NEURONTIN) 400 MG capsule TAKE ONE CAPSULE BY MOUTH IN THE MORNING, AND 3 OR 4 AT BEDTIME  6  . HYDROcodone-acetaminophen (NORCO) 10-325 MG tablet     . LORazepam (ATIVAN) 2 MG tablet TAKE 1 TO 2 TABLETS AT BEDTIME  2  . MIMVEY 1-0.5 MG tablet Take 1 tablet by mouth daily.  11  . NEUPRO 2 MG/24HR APPLY 1 PATCH DAILY AS DIRECTED.  4  . PARoxetine (PAXIL) 20 MG tablet Take 1 tablet (20 mg total) by mouth daily. take one a day. 30 tablet 2  . propranolol ER (INDERAL LA) 160 MG SR capsule Take 160 mg by mouth daily.  5  . simvastatin (ZOCOR) 20 MG tablet Take 20 mg by mouth daily.  3  . [DISCONTINUED] buPROPion (WELLBUTRIN) 75 MG tablet Take 1 tablet (75 mg total) by mouth  every morning. (Patient not taking: Reported on 01/26/2015) 30 tablet 1  . [DISCONTINUED] escitalopram (LEXAPRO) 10 MG tablet Take 1 tablet (10 mg total) by mouth daily. Take half tablet for first 5 days and then one tablet a day. (Patient not taking: Reported on 12/14/2014) 30 tablet 0  . [DISCONTINUED] traZODone (DESYREL) 50 MG tablet Take 1 tablet (50 mg total) by mouth at bedtime. (Patient not taking: Reported on 12/14/2014) 30 tablet 0   No current facility-administered medications for this visit.      Treatment Plan Summary: Medication management and Plan as follows   MDD: contijnue paxil 20mg  it has helped Generalized anxiety disorder; paxil. Also on ativan Insomnia; may be related with high doses of Ativan and other pain medications. She will have to start working slowly on cutting down melatonin, cut down Ativan. Discussed and reviewed sleep hygiene Also consider sleep study. Do not take all pain meds at night to avoid tolerance. Has not cut down on ativan. Understand risk of pain meds and ativan  together and tolerance. Medical complexity; including fibromyalgia.epidural has helped her back pain. will need adjustment from our side and patients sleep habits. She is also advised to avoid any alcohol. Recommend psychotherapy. More than 50% time spent in counseling and coordination came to inpatient education, sleep review patient education and review of side effects Call 911 or report local emergency room for any urgent concerns of suicidal thoughts Time spent: 25 minutes Follow-up in 10  weeks or earlier if needed   Danielle Boyd 5/17/20178:45 AM

## 2015-06-09 ENCOUNTER — Ambulatory Visit (HOSPITAL_BASED_OUTPATIENT_CLINIC_OR_DEPARTMENT_OTHER)
Admission: RE | Admit: 2015-06-09 | Discharge: 2015-06-09 | Disposition: A | Discharge status: Home / Self Care | Payer: BLUE CROSS/BLUE SHIELD | Source: Ambulatory Visit | Attending: Nurse Practitioner | Admitting: Nurse Practitioner

## 2015-06-09 ENCOUNTER — Other Ambulatory Visit (HOSPITAL_BASED_OUTPATIENT_CLINIC_OR_DEPARTMENT_OTHER): Payer: Self-pay | Admitting: Nurse Practitioner

## 2015-06-09 DIAGNOSIS — M79602 Pain in left arm: Secondary | ICD-10-CM | POA: Insufficient documentation

## 2015-08-21 ENCOUNTER — Ambulatory Visit (HOSPITAL_COMMUNITY): Payer: BLUE CROSS/BLUE SHIELD | Admitting: Psychiatry

## 2015-08-23 ENCOUNTER — Other Ambulatory Visit (HOSPITAL_COMMUNITY): Payer: Self-pay | Admitting: Psychiatry

## 2015-08-23 NOTE — Telephone Encounter (Signed)
Received refill request from [facility] for Paxil 20mg . Per Dr. Gilmore LarocheAkhtar, refill request is denied. Pt will need to schedule an appt with clinic. LVM for pt to return call to office.

## 2015-09-05 ENCOUNTER — Telehealth (HOSPITAL_COMMUNITY): Payer: Self-pay | Admitting: Psychiatry

## 2015-09-05 MED ORDER — PAROXETINE HCL 20 MG PO TABS: 20.0000 mg | ORAL_TABLET | Freq: Every day | ORAL | 0 refills | Status: DC

## 2015-09-05 NOTE — Telephone Encounter (Signed)
Pt request a refill for Paxil 20mg . Per Dr. Gilmore LarocheAkhtar, refill request is authorized for Paxil 20mg , #30 w/ 0 refills. Rx was sent to pharmacy. Called and informed pt of refill status. PT is schedule for a f/u appt on 09/10/15. Pt verbalizes understanding.

## 2015-09-10 ENCOUNTER — Ambulatory Visit (HOSPITAL_COMMUNITY): Payer: BLUE CROSS/BLUE SHIELD | Admitting: Psychiatry

## 2015-09-11 ENCOUNTER — Encounter (HOSPITAL_COMMUNITY): Payer: Self-pay | Admitting: Psychiatry

## 2015-09-11 ENCOUNTER — Ambulatory Visit (INDEPENDENT_AMBULATORY_CARE_PROVIDER_SITE_OTHER): Payer: BLUE CROSS/BLUE SHIELD | Admitting: Psychiatry

## 2015-09-11 VITALS — BP 116/68 | HR 71 | Ht 65.5 in | Wt 194.0 lb

## 2015-09-11 DIAGNOSIS — G4700 Insomnia, unspecified: Secondary | ICD-10-CM

## 2015-09-11 DIAGNOSIS — F331 Major depressive disorder, recurrent, moderate: Secondary | ICD-10-CM | POA: Diagnosis not present

## 2015-09-11 DIAGNOSIS — M797 Fibromyalgia: Secondary | ICD-10-CM | POA: Diagnosis not present

## 2015-09-11 DIAGNOSIS — F411 Generalized anxiety disorder: Secondary | ICD-10-CM | POA: Diagnosis not present

## 2015-09-11 MED ORDER — PAROXETINE HCL 20 MG PO TABS: 30.0000 mg | ORAL_TABLET | Freq: Every day | ORAL | 2 refills | Status: DC

## 2015-09-11 NOTE — Progress Notes (Signed)
Patient ID: Danielle Boyd, female   DOB: 10/20/1955, 60 y.o.   MRN: [id number]  [facility]  Patient Identification: Danielle Boyd MRN:  [id number] Date of Evaluation:  09/11/2015 Referral Source: Self and Dr. Alton RevereFeraru (Neurology) Chief Complaint:   Chief Complaint    Follow-up     Visit Diagnosis:    ICD-9-CM ICD-10-CM   1. Moderate episode of recurrent major depressive disorder (HCC) 296.32 F33.1   2. GAD (generalized anxiety disorder) 300.02 F41.1   3. Insomnia 780.52 G47.00   4. Fibromyalgia 729.1 M79.7    Diagnosis:   Patient Active Problem List   Diagnosis Date Noted  . Benign hypertension [I10] 04/04/2015  . Adult BMI 30+ [E66.8] 03/27/2015  . Acid reflux [K21.9] 03/27/2015  . 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 Snores [R06.83] 03/06/2015  . Restless leg [G25.81] 03/06/2015  . Pins and needles sensation [R20.2] 03/06/2015  . Leg pain [M79.606] 03/06/2015  . Cannot sleep [G47.00] 03/06/2015  . 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 BP (high blood pressure) [I10] 03/06/2015  . HLD (hyperlipidemia) [E78.5] 03/06/2015  . Fatigue [R53.83] 03/06/2015  . Benign essential tremor [G25.0] 03/06/2015  . Clinical depression [F32.9] 03/06/2015  . 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 Leg paresthesia [R20.2] 03/06/2015  . Fibromyalgia [M79.7] 11/28/2014  . Low back pain [M54.5] 11/28/2014  . 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  Initially presented with depression. Crying spells and thinking about her granddaughter, difficulty sleeping high tolerance of pain medication and fibromyalgia.  Paxil is  helping the depression. She still has sleep issues  goes to bed late cannot sleep. She wakes up tired. We talked about possibly sleep apnea. stiill not scheduled yet.  She continues to take Vicodin sometimes Vicodin keeps her more charged. She has cut down doses of vicodin and neurontin. Anxiety fluctuates. Takes ativan. Also on paxil  Aggravating factors; some developmental delays of her granddaughter. Medical complexity including fibromyalgia and restless legs. At times finances Modifying factors; her supportive husband and her daughter. Medical complexity; high blood pressure, chronic back pain. Fibromyalgia  Location; depression and anxiety, insomnia  Severity; 7 out of 10. 10 being no depression Context; her granddaughter. Shoulder pain and concern of back surgery or epidural Duration; 7 years  (Hypo) Manic Symptoms:  Distractibility, Anxiety Symptoms:  Excessive Worry,(better) Psychotic Symptoms:  denies PTSD Symptoms: NA     Past Medical History:  Past Medical History:  Diagnosis Date  . Fatigue    History reviewed. No pertinent surgical history. Family History: History reviewed. No pertinent family history. Social History:   Social History   Social History  . Marital status: Married    Spouse name: N/A  . Number of children: N/A  . Years of education: N/A   Social History Main Topics  . Smoking status: Never Smoker  . Smokeless tobacco: Never Used  . Alcohol use 0.6 oz/week    1 Cans of beer per week  . Drug use: No  . Sexual activity: Not Currently   Other Topics Concern  . None   Social History Narrative  . None     Musculoskeletal: Strength & Muscle Tone: within normal limits Gait & Station: normal Patient  leans: N/A  Psychiatric Specialty Exam: HPI  Review of Systems  Constitutional: Positive for malaise/fatigue. Negative for fever.  Cardiovascular: Negative for chest pain.  Musculoskeletal: Positive for myalgias.  Skin: Negative for itching  and rash.  Neurological: Negative for tingling and tremors.  Psychiatric/Behavioral: Negative for substance abuse and suicidal ideas. The patient has insomnia.     Blood pressure 116/68, pulse 71, height 5' 5.5" (1.664 m), weight 194 lb (88 kg), SpO2 94 %.Body mass index is 31.79 kg/m.  General Appearance: Casual  Eye Contact:  Fair  Speech:  Slow  Volume:  Decreased  Mood:  Somewhat dysphoric  Affect:  constricted  Thought Process:  Coherent  Orientation:  Full (Time, Place, and Person)  Thought Content:  Rumination  Suicidal Thoughts:  No  Homicidal Thoughts:  No  Memory:  Immediate;   Fair Recent;   Fair  Judgement:  Fair  Insight:  Shallow  Psychomotor Activity:  Decreased  Concentration:  Fair  Recall:  [employer] of Knowledge:Fair  Language: Fair  Akathisia:  Negative  Handed:  Right  AIMS (if indicated):    Assets:  Desire for Improvement  ADL's:  Intact  Cognition: WNL  Sleep:  poor   Is the patient at risk to self?  No. Has the patient been a risk to self in the past 6 months?  No. Has the patient been a risk to self within the distant past?  No.   Allergies:   Allergies  Allergen Reactions  . Duloxetine Other (See Comments)    Abdominal pain  . Milnacipran Nausea Only  . Nsaids Nausea Only     GI upse  . Pramipexole Other (See Comments)    Insomnia  . Ropinirole Nausea Only  . Tolmetin Nausea Only        Current Medications: Current Outpatient Prescriptions  Medication Sig Dispense Refill  . gabapentin (NEURONTIN) 400 MG capsule TAKE ONE CAPSULE BY MOUTH IN THE MORNING, AND 3 OR 4 AT BEDTIME  6  . HYDROcodone-acetaminophen (NORCO) 10-325 MG tablet     . HYDROcodone-acetaminophen (NORCO) 10-325 MG tablet Take by mouth.    Marland Kitchen LORazepam (ATIVAN) 2 MG tablet TAKE 1 TO 2 TABLETS AT BEDTIME  2  . LORazepam (ATIVAN) 2 MG tablet Take 4 mg by mouth.    Marland Kitchen MIMVEY 1-0.5 MG tablet Take 1 tablet by mouth daily.  11  . NEUPRO 2 MG/24HR APPLY 1 PATCH DAILY AS  DIRECTED.  4  . PARoxetine (PAXIL) 20 MG tablet Take 1.5 tablets (30 mg total) by mouth daily. 45 tablet 2  . propranolol ER (INDERAL LA) 160 MG SR capsule Take 160 mg by mouth daily.  5  . rivaroxaban (XARELTO) 20 MG TABS tablet Take 20 mg by mouth.    . simvastatin (ZOCOR) 20 MG tablet Take 20 mg by mouth daily.  3   No current facility-administered medications for this visit.       Treatment Plan Summary: Medication management and Plan as follows   MDD: contijnue paxil 20mg  it has helped Generalized anxiety disorder; paxil. Also on ativan Insomnia; may be related with high doses of Ativan and other pain medications. She will have to start working slowly on cutting down melatonin, cut down Ativan. Discussed and reviewed sleep hygiene Still has not cut down ativan. Understands all risk of sedation and overdose.  Also consider sleep study. Do not take all pain meds at night to avoid tolerance.  Medical complexity; including fibromyalgia.epidural has helped  her back pain. will need adjustment from our side and patients sleep habits. She is also advised to avoid any alcohol. Recommend psychotherapy. More than 50% time spent in counseling and coordination came to inpatient education, sleep review patient education and review of side effects Call 911 or report local emergency room for any urgent concerns of suicidal thoughts Time spent: 25 minutes Follow-up in 10  weeks or earlier if needed   Cas Tracz 8/29/201710:46 AM

## 2015-12-05 ENCOUNTER — Ambulatory Visit (HOSPITAL_COMMUNITY): Payer: BLUE CROSS/BLUE SHIELD | Admitting: Psychiatry

## 2015-12-12 ENCOUNTER — Ambulatory Visit (INDEPENDENT_AMBULATORY_CARE_PROVIDER_SITE_OTHER): Payer: BLUE CROSS/BLUE SHIELD | Admitting: Psychiatry

## 2015-12-12 ENCOUNTER — Encounter (HOSPITAL_COMMUNITY): Payer: Self-pay | Admitting: Psychiatry

## 2015-12-12 DIAGNOSIS — F5102 Adjustment insomnia: Secondary | ICD-10-CM | POA: Diagnosis not present

## 2015-12-12 DIAGNOSIS — F411 Generalized anxiety disorder: Secondary | ICD-10-CM | POA: Diagnosis not present

## 2015-12-12 DIAGNOSIS — F331 Major depressive disorder, recurrent, moderate: Secondary | ICD-10-CM

## 2015-12-12 DIAGNOSIS — M797 Fibromyalgia: Secondary | ICD-10-CM | POA: Diagnosis not present

## 2015-12-12 DIAGNOSIS — Z888 Allergy status to other drugs, medicaments and biological substances status: Secondary | ICD-10-CM

## 2015-12-12 DIAGNOSIS — Z79899 Other long term (current) drug therapy: Secondary | ICD-10-CM

## 2015-12-12 MED ORDER — PAROXETINE HCL 20 MG PO TABS: 30.0000 mg | ORAL_TABLET | Freq: Every day | ORAL | 2 refills | Status: DC

## 2015-12-12 NOTE — Progress Notes (Signed)
Patient ID: Danielle Boyd, female   DOB: [birth year], 60 y.o.   MRN: [id number]  [facility]  Patient Identification: Danielle Boyd MRN:  [id number] Date of Evaluation:  12/12/2015 Referral Source: Self and Dr. Alton RevereFeraru (Neurology) Chief Complaint:   Chief Complaint    Follow-up     Visit Diagnosis:    ICD-9-CM ICD-10-CM   1. Moderate episode of recurrent major depressive disorder (HCC) 296.32 F33.1   2. GAD (generalized anxiety disorder) 300.02 F41.1   3. Adjustment insomnia 307.41 F51.02   4. Fibromyalgia 729.1 M79.7    Diagnosis:   Patient Active Problem List   Diagnosis Date Noted  . Benign hypertension [I10] 04/04/2015  . Adult BMI 30+ [IMO0002] 03/27/2015  . Acid reflux [K21.9] 03/27/2015  . 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 Snores [R06.83] 03/06/2015  . Restless leg [G25.81] 03/06/2015  . Pins and needles sensation [R20.2] 03/06/2015  . Leg pain [M79.606] 03/06/2015  . Cannot sleep [G47.00] 03/06/2015  . 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 BP (high blood pressure) [I10] 03/06/2015  . HLD (hyperlipidemia) [E78.5] 03/06/2015  . Fatigue [R53.83] 03/06/2015  . Benign essential tremor [G25.0] 03/06/2015  . Clinical depression [F32.9] 03/06/2015  . 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 Leg paresthesia [R20.2] 03/06/2015  . Fibromyalgia [M79.7] 11/28/2014  . Low back pain [M54.5] 11/28/2014  . 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  Initially presented with depression. Crying spells and thinking about her granddaughter, difficulty sleeping high tolerance of pain medication and  fibromyalgia.   Patient still feels down at times as severely sleeping without Ativan she understands it is a high dose. She feels Paxil needs to be increased because of her current stressors including worries about physical health including recurrent UTI infection Other aggravating factors are developmental delays of her granddaughter but she has been doing somewhat better medical complexity includes fibromyalgia does keep her achy and pain at times if she does not take her medication regularly Modifying factors are supportive husband   Anxiety fluctuates Insomnia fluctuates unless she takes her Ativan which is a high dose and she understands No associated psychotic symptoms.   Severity of depression: 6/10. Somewhat worsened.      Past Medical History:  Past Medical History:  Diagnosis Date  . Fatigue    History reviewed. No pertinent surgical history. Family History: History reviewed. No pertinent family history. Social History:   Social History   Social History  . Marital status: Married    Spouse name: N/A  . Number of children: N/A  . Years of education: N/A   Social History Main Topics  . Smoking status: Never Smoker  . Smokeless tobacco: Never Used  . Alcohol use 0.6 oz/week    1 Cans of beer per week  . Drug use: No  . Sexual activity: Not Currently   Other Topics Concern  . None   Social History Narrative  . None     Psychiatric Specialty Exam: HPI  Review of Systems  Constitutional: Positive for malaise/fatigue. Negative for fever.  Cardiovascular: Negative for chest pain.  Musculoskeletal: Positive for myalgias.  Skin: Negative for itching and rash.  Neurological: Negative for tingling and tremors.  Psychiatric/Behavioral: Negative  for depression, substance abuse and suicidal ideas. The patient is nervous/anxious and has insomnia.     There were no vitals taken for this visit.There is no height or weight on file to calculate BMI.  General  Appearance: Casual  Eye Contact:  Fair  Speech:  Slow  Volume:  Decreased  Mood: dysphoric  Affect:  Somewhat reactive  Thought Process:  Coherent  Orientation:  Full (Time, Place, and Person)  Thought Content:  Rumination  Suicidal Thoughts:  No  Homicidal Thoughts:  No  Memory:  Immediate;   Fair Recent;   Fair  Judgement:  Fair  Insight:  Shallow  Psychomotor Activity:  Decreased  Concentration:  Fair  Recall:  [employer]  Fund of Knowledge:Fair  Language: Fair  Akathisia:  Negative  Handed:  Right  AIMS (if indicated):    Assets:  Desire for Improvement  ADL's:  Intact  Cognition: WNL  Sleep:  poor     Allergies:   Allergies  Allergen Reactions  . Duloxetine Other (See Comments)    Abdominal pain  . Milnacipran Nausea Only  . Nsaids Nausea Only     GI upse  . Pramipexole Other (See Comments)    Insomnia  . Ropinirole Nausea Only  . Tolmetin Nausea Only        Current Medications: Current Outpatient Prescriptions  Medication Sig Dispense Refill  . gabapentin (NEURONTIN) 400 MG capsule TAKE ONE CAPSULE BY MOUTH IN THE MORNING, AND 3 OR 4 AT BEDTIME  6  . HYDROcodone-acetaminophen (NORCO) 10-325 MG tablet     . HYDROcodone-acetaminophen (NORCO) 10-325 MG tablet Take by mouth.    Marland Kitchen. LORazepam (ATIVAN) 2 MG tablet TAKE 1 TO 2 TABLETS AT BEDTIME  2  . LORazepam (ATIVAN) 2 MG tablet Take 4 mg by mouth.    Marland Kitchen. MIMVEY 1-0.5 MG tablet Take 1 tablet by mouth daily.  11  . NEUPRO 2 MG/24HR APPLY 1 PATCH DAILY AS DIRECTED.  4  . PARoxetine (PAXIL) 20 MG tablet Take 1.5 tablets (30 mg total) by mouth daily. 45 tablet 2  . propranolol ER (INDERAL LA) 160 MG SR capsule Take 160 mg by mouth daily.  5  . rivaroxaban (XARELTO) 20 MG TABS tablet Take 20 mg by mouth.    . simvastatin (ZOCOR) 20 MG tablet Take 20 mg by mouth daily.  3   No current facility-administered medications for this visit.       Treatment Plan Summary: Medication management and Plan as follows  1. MDD:  worsened. Increase paxil to 30mg . Prescription sent 2. GAD: increase paxil as above Insomnia: reviewed sleep hygiene. Advised to slow taper down ativan but she remains reluctant Provided supportive therapy in regard to her stressors.    Follow-up in 10  weeks or earlier if needed   Shilo Philipson 11/29/20178:34 AM

## 2015-12-17 ENCOUNTER — Other Ambulatory Visit (HOSPITAL_COMMUNITY): Payer: Self-pay | Admitting: Psychiatry

## 2015-12-26 NOTE — Telephone Encounter (Signed)
Received fax from [facility] requesting a refill for Paxil. Per Dr. Gilmore LarocheAkhtar, refill request is denied. Pt has request refill too early. Rx was sent to pharmacy on 11/28 for Paxil 20mg , 45 w/ 2 refills. Pt's f/u apt is schedule on 2/21. Nothing further is need at this time.

## 2016-03-05 ENCOUNTER — Encounter (HOSPITAL_COMMUNITY): Payer: Self-pay | Admitting: Psychiatry

## 2016-03-05 ENCOUNTER — Ambulatory Visit (INDEPENDENT_AMBULATORY_CARE_PROVIDER_SITE_OTHER): Payer: BLUE CROSS/BLUE SHIELD | Admitting: Psychiatry

## 2016-03-05 VITALS — BP 130/82 | HR 68 | Resp 16 | Ht 65.5 in | Wt 201.0 lb

## 2016-03-05 DIAGNOSIS — F331 Major depressive disorder, recurrent, moderate: Secondary | ICD-10-CM | POA: Diagnosis not present

## 2016-03-05 DIAGNOSIS — Z79899 Other long term (current) drug therapy: Secondary | ICD-10-CM

## 2016-03-05 DIAGNOSIS — Z888 Allergy status to other drugs, medicaments and biological substances status: Secondary | ICD-10-CM

## 2016-03-05 DIAGNOSIS — F5102 Adjustment insomnia: Secondary | ICD-10-CM | POA: Diagnosis not present

## 2016-03-05 DIAGNOSIS — F411 Generalized anxiety disorder: Secondary | ICD-10-CM

## 2016-03-05 DIAGNOSIS — M797 Fibromyalgia: Secondary | ICD-10-CM

## 2016-03-05 MED ORDER — PAROXETINE HCL 20 MG PO TABS: 30.0000 mg | ORAL_TABLET | Freq: Every day | ORAL | 2 refills | Status: DC

## 2016-03-05 NOTE — Progress Notes (Signed)
Patient ID: Danielle Hollererri Michalowski, female   DOB: 06/10/1955, 61 y.o.   MRN: [id number]  [facility]  Patient Identification: Danielle Boyd MRN:  [id number] Date of Evaluation:  03/05/2016 Referral Source: Self and Dr. Alton RevereFeraru (Neurology) Chief Complaint:   Chief Complaint    Follow-up     Visit Diagnosis:    ICD-9-CM ICD-10-CM   1. Moderate episode of recurrent major depressive disorder (HCC) 296.32 F33.1   2. GAD (generalized anxiety disorder) 300.02 F41.1   3. Adjustment insomnia 307.41 F51.02   4. Fibromyalgia 729.1 M79.7    Diagnosis:   Patient Active Problem List   Diagnosis Date Noted  . Benign hypertension [I10] 04/04/2015  . Adult BMI 30+ [IMO0002] 03/27/2015  . Acid reflux [K21.9] 03/27/2015  . 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 Snores [R06.83] 03/06/2015  . Restless leg [G25.81] 03/06/2015  . Pins and needles sensation [R20.2] 03/06/2015  . Leg pain [M79.606] 03/06/2015  . Cannot sleep [G47.00] 03/06/2015  . 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 BP (high blood pressure) [I10] 03/06/2015  . HLD (hyperlipidemia) [E78.5] 03/06/2015  . Fatigue [R53.83] 03/06/2015  . Benign essential tremor [G25.0] 03/06/2015  . Clinical depression [F32.9] 03/06/2015  . 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 Leg paresthesia [R20.2] 03/06/2015  . Fibromyalgia [M79.7] 11/28/2014  . Low back pain [M54.5] 11/28/2014  . History of atypical nevus [Z87.2] 10/12/2012   History of Present Illness:  61 years old currently married Caucasian female initially referred by neurology and herself for management of depression, insomnia and anxiety.  Initially presented with depression. Crying spells and thinking about her granddaughter, difficulty sleeping high tolerance of pain medication and  fibromyalgia.  Last visit increased the Paxil for depression that has helped some but she was concerned about her recurrent UTI and tremors she's following the neurologist she is on pain medication, gabapentin, Inderal. Her stresses related to her physical condition and the tremors according to her Paxil did not make it worse she already had it before that. She is not drinking any excessive coffee or nicotine She has cut down her oxycodone from 4 tablets a day to 1 tablet a day. She feels proud of that and she is working on continuing gabapentin for now Other aggravating factors are developmental delays of her granddaughter but she has been doing somewhat better medical complexity includes fibromyalgia does keep her achy and pain at times if she does not take her medication regularly Modifying factors: husband   Anxiety fluctuates Insomnia fluctuates unless she takes her Ativan which is a high dose and she understands No associated psychotic symptoms.   Severity of depression: 6/10. Somewhat worsened.      Past Medical History:  Past Medical History:  Diagnosis Date  . Fatigue    History reviewed. No pertinent surgical history. Family History: History reviewed. No pertinent family history. Social History:   Social History   Social History  . Marital status: Married    Spouse name: N/A  . Number of children: N/A  . Years of education: N/A   Social History Main Topics  . Smoking status: Never Smoker  . Smokeless tobacco: Never Used  . Alcohol use 0.6 oz/week    1 Cans of beer per week  . Drug use: No  . Sexual activity: Not Currently   Other Topics Concern  . None  Social History Narrative  . None     Psychiatric Specialty Exam: HPI  Review of Systems  Constitutional: Positive for malaise/fatigue. Negative for fever.  Cardiovascular: Negative for palpitations.  Musculoskeletal: Positive for myalgias.  Skin: Negative for itching and rash.  Neurological: Positive  for tremors. Negative for tingling.  Psychiatric/Behavioral: Negative for depression, substance abuse and suicidal ideas. The patient is nervous/anxious.     Blood pressure 130/82, pulse 68, resp. rate 16, height 5' 5.5" (1.664 m), weight 201 lb (91.2 kg), SpO2 94 %.Body mass index is 32.94 kg/m.  General Appearance: Casual  Eye Contact:  Fair  Speech:  Slow  Volume:  Decreased  Mood: somewhat dysphoric  Affect:  fair  Thought Process:  Coherent  Orientation:  Full (Time, Place, and Person)  Thought Content:  Rumination  Suicidal Thoughts:  No  Homicidal Thoughts:  No  Memory:  Immediate;   Fair Recent;   Fair  Judgement:  Fair  Insight:  Shallow  Psychomotor Activity:  Decreased  Concentration:  Fair  Recall:  [employer] of Knowledge:Fair  Language: Fair  Akathisia:  Negative  Handed:  Right  AIMS (if indicated):    Assets:  Desire for Improvement  ADL's:  Intact  Cognition: WNL  Sleep:  poor     Allergies:   Allergies  Allergen Reactions  . Duloxetine Other (See Comments)    Abdominal pain  . Milnacipran Nausea Only  . Nsaids Nausea Only     GI upse  . Pramipexole Other (See Comments)    Insomnia  . Ropinirole Nausea Only  . Tolmetin Nausea Only        Current Medications: Current Outpatient Prescriptions  Medication Sig Dispense Refill  . gabapentin (NEURONTIN) 400 MG capsule TAKE ONE CAPSULE BY MOUTH IN THE MORNING, AND 3 OR 4 AT BEDTIME  6  . HYDROcodone-acetaminophen (NORCO) 10-325 MG tablet Take by mouth.    Marland Kitchen LORazepam (ATIVAN) 2 MG tablet TAKE 1 TO 2 TABLETS AT BEDTIME  2  . NEUPRO 2 MG/24HR APPLY 1 PATCH DAILY AS DIRECTED.  4  . nitrofurantoin, macrocrystal-monohydrate, (MACROBID) 100 MG capsule Take 100 mg by mouth.    Marland Kitchen PARoxetine (PAXIL) 20 MG tablet Take 1.5 tablets (30 mg total) by mouth daily. 45 tablet 2  . propranolol ER (INDERAL LA) 160 MG SR capsule Take 160 mg by mouth daily.  5  . rivaroxaban (XARELTO) 20 MG TABS tablet Take 20 mg by  mouth.    . simvastatin (ZOCOR) 20 MG tablet Take 20 mg by mouth daily.  3   No current facility-administered medications for this visit.       Treatment Plan Summary: Medication management and Plan as follows  1. MDD: relavant to her physical conditions. Continue paxil 30mg  2. GAD: fluctuates, continue paxil 30mg  Insomnia; on ativan. Cannot sleep without it Advised to follow up with providers in relavant to her uti and tremors.  She has neurology appointment.   paxil sent. Questions addressed and side effects reviewed FU 3 months   Abisai Coble 2/21/20189:05 AM

## 2016-03-18 ENCOUNTER — Ambulatory Visit (INDEPENDENT_AMBULATORY_CARE_PROVIDER_SITE_OTHER): Payer: BLUE CROSS/BLUE SHIELD | Admitting: Psychiatry

## 2016-03-18 DIAGNOSIS — F411 Generalized anxiety disorder: Secondary | ICD-10-CM | POA: Diagnosis not present

## 2016-03-18 DIAGNOSIS — Z888 Allergy status to other drugs, medicaments and biological substances status: Secondary | ICD-10-CM

## 2016-03-18 DIAGNOSIS — F331 Major depressive disorder, recurrent, moderate: Secondary | ICD-10-CM | POA: Diagnosis not present

## 2016-03-18 DIAGNOSIS — F5102 Adjustment insomnia: Secondary | ICD-10-CM

## 2016-03-18 DIAGNOSIS — Z79899 Other long term (current) drug therapy: Secondary | ICD-10-CM

## 2016-03-18 DIAGNOSIS — M797 Fibromyalgia: Secondary | ICD-10-CM | POA: Diagnosis not present

## 2016-03-18 MED ORDER — PAROXETINE HCL 20 MG PO TABS: 10.0000 mg | ORAL_TABLET | Freq: Every day | ORAL | 0 refills | Status: DC

## 2016-03-18 MED ORDER — ARIPIPRAZOLE 5 MG PO TABS: 5.0000 mg | ORAL_TABLET | Freq: Every day | ORAL | 0 refills | Status: DC

## 2016-03-18 NOTE — Progress Notes (Signed)
Patient ID: Danielle Boyd, female   DOB: [birth year], 61 y.o.   MRN: [id number]  [facility] Outpatient Visit  Patient Identification: Danielle Boyd MRN:  [id number] Date of Evaluation:  03/18/2016 Referral Source: Self and Dr. Alton Boyd (Neurology) Chief Complaint:   Chief Complaint    Stress; Other     Visit Diagnosis:    ICD-9-CM ICD-10-CM   1. Moderate episode of recurrent major depressive disorder (HCC) 296.32 F33.1   2. GAD (generalized anxiety disorder) 300.02 F41.1   3. Adjustment insomnia 307.41 F51.02   4. Fibromyalgia 729.1 M79.7    Diagnosis:   Patient Active Problem List   Diagnosis Date Noted  . Benign hypertension [I10] 04/04/2015  . Adult BMI 30+ [IMO0002] 03/27/2015  . Acid reflux [K21.9] 03/27/2015  . 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 Snores [R06.83] 03/06/2015  . Restless leg [G25.81] 03/06/2015  . Pins and needles sensation [R20.2] 03/06/2015  . Leg pain [M79.606] 03/06/2015  . Cannot sleep [G47.00] 03/06/2015  . 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 BP (high blood pressure) [I10] 03/06/2015  . HLD (hyperlipidemia) [E78.5] 03/06/2015  . Fatigue [R53.83] 03/06/2015  . Benign essential tremor [G25.0] 03/06/2015  . Clinical depression [F32.9] 03/06/2015  . 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 Leg paresthesia [R20.2] 03/06/2015  . Fibromyalgia [M79.7] 11/28/2014  . Low back pain [M54.5] 11/28/2014  . History of atypical nevus [Z87.2] 10/12/2012   History of Present Illness:  61 years old currently married Caucasian female initially referred by neurology and herself for management of depression, insomnia and anxiety.   Patient presents with an urgent visit from her primary care physician office regarding a call that she was feeling sad depressed and was suicidal. She signed a  no harm contract states that she does not want to harm herself but for the last 1 or 2 days she's been feeling really low and down. Off talked to her husband he will take care that there is no guns or ammunition or rifles and her access.  Patient states not Swan to homicidal but thesis at different feeling that she's had for the last 2 days. Last visit we increased the Paxil to 30 mg. She is on pain medication once a day now she is taking Ativan 2 mg tablets 2 tablets at night if cautioned not to increase the dose and also she understands it can cause depression in the long run and needs to work on is slowly to cut it down.  Feeling sad with no new trigger.  Denies using alcohol  Modifying factors: husband   Anxiety fluctuates Insomnia fluctuates unless she takes her Ativan which is a high dose and she understands No associated psychotic symptoms.   Severity of depression: worsened: 2/10. 10 being no depression.  Again states not want to kill herself but feels sad and would call or come early if depression gets worse.     Past Medical History:  Past Medical History:  Diagnosis Date  . Fatigue    No past surgical history on file. Family History: No family history on file. Social History:   Social History   Social History  . Marital status: Married    Spouse name: N/A  . Number of children: N/A  . Years of education: N/A   Social History Main Topics  . Smoking status: Never Smoker  . Smokeless tobacco: Never  Used  . Alcohol use 0.6 oz/week    1 Cans of beer per week  . Drug use: No  . Sexual activity: Not Currently   Other Topics Concern  . Not on file   Social History Narrative  . No narrative on file     Psychiatric Specialty Exam: HPI  Review of Systems  Constitutional: Positive for malaise/fatigue. Negative for fever.  Cardiovascular: Negative for palpitations.  Musculoskeletal: Positive for myalgias.  Skin: Negative for itching and rash.  Neurological:  Positive for tremors. Negative for tingling.  Psychiatric/Behavioral: Positive for depression. Negative for substance abuse and suicidal ideas. The patient is nervous/anxious.     There were no vitals taken for this visit.There is no height or weight on file to calculate BMI.  General Appearance: Casual  Eye Contact:  Fair  Speech:  Slow  Volume:  Decreased  Mood: depressed  Affect:  depressed  Thought Process:  Coherent  Orientation:  Full (Time, Place, and Person)  Thought Content:  Rumination  Suicidal Thoughts:  No  Homicidal Thoughts:  No  Memory:  Immediate;   Fair Recent;   Fair  Judgement:  Fair  Insight:  Shallow  Psychomotor Activity:  Decreased  Concentration:  Fair  Recall:  [employer] of Knowledge:Fair  Language: Fair  Akathisia:  Negative  Handed:  Right  AIMS (if indicated):    Assets:  Desire for Improvement  ADL's:  Intact  Cognition: WNL  Sleep:  poor     Allergies:   Allergies  Allergen Reactions  . Duloxetine Other (See Comments)    Abdominal pain  . Milnacipran Nausea Only  . Nsaids Nausea Only     GI upse  . Pramipexole Other (See Comments)    Insomnia  . Ropinirole Nausea Only  . Tolmetin Nausea Only        Current Medications: Current Outpatient Prescriptions  Medication Sig Dispense Refill  . gabapentin (NEURONTIN) 400 MG capsule TAKE ONE CAPSULE BY MOUTH IN THE MORNING, AND 3 OR 4 AT BEDTIME  6  . HYDROcodone-acetaminophen (NORCO) 10-325 MG tablet Take by mouth.    Marland Kitchen LORazepam (ATIVAN) 2 MG tablet TAKE 1 TO 2 TABLETS AT BEDTIME  2  . NEUPRO 2 MG/24HR APPLY 1 PATCH DAILY AS DIRECTED.  4  . nitrofurantoin, macrocrystal-monohydrate, (MACROBID) 100 MG capsule Take 100 mg by mouth.    Marland Kitchen PARoxetine (PAXIL) 20 MG tablet Take 1.5 tablets (30 mg total) by mouth daily. 45 tablet 2  . propranolol ER (INDERAL LA) 160 MG SR capsule Take 160 mg by mouth daily.  5  . rivaroxaban (XARELTO) 20 MG TABS tablet Take 20 mg by mouth.    . simvastatin  (ZOCOR) 20 MG tablet Take 20 mg by mouth daily.  3   No current facility-administered medications for this visit.       Treatment Plan Summary: Medication management and Plan as follows  1. MDD: severe: cut down paxil to 10mg  to avoid withdrawals. She is on 30mg  and was increased to this dose recently. Will add abilify 5mg  for augmetnation for depression.  She will be seen in 2-3 days again.  Guns and rifles will not be in her control. Husband was here and agrees to that.   2. GAD: fluctuates. She is on ativan. And now small dose of paxil.  Would see her back in 2-3 days . Insomnia; on ativan. Cannot sleep without it. Does not want to cut down . Understands the risks.  Reviewed  sleep hygiene.  Advised to follow up with other providers as per her medical needs regularly.  Husband agrees she should not sleep during the day and to work on lowering ativan slowly.  Patient still reluctant to lower ativan but agrees to work on it in near future.  Will start therapy and scheudle that   Vasily Fedewa 3/6/20184:25 PM

## 2016-03-18 NOTE — Patient Instructions (Addendum)
Take all guns away and husband agreed to lock them away Take abilify 5mg  a day Lower paxil 10mg  a day Start therapy

## 2016-03-21 ENCOUNTER — Ambulatory Visit (INDEPENDENT_AMBULATORY_CARE_PROVIDER_SITE_OTHER): Payer: BLUE CROSS/BLUE SHIELD | Admitting: Psychiatry

## 2016-03-21 ENCOUNTER — Encounter (HOSPITAL_COMMUNITY): Payer: Self-pay | Admitting: Psychiatry

## 2016-03-21 DIAGNOSIS — Z79899 Other long term (current) drug therapy: Secondary | ICD-10-CM | POA: Diagnosis not present

## 2016-03-21 DIAGNOSIS — F331 Major depressive disorder, recurrent, moderate: Secondary | ICD-10-CM | POA: Diagnosis not present

## 2016-03-21 DIAGNOSIS — Z888 Allergy status to other drugs, medicaments and biological substances status: Secondary | ICD-10-CM

## 2016-03-21 DIAGNOSIS — M797 Fibromyalgia: Secondary | ICD-10-CM | POA: Diagnosis not present

## 2016-03-21 DIAGNOSIS — F411 Generalized anxiety disorder: Secondary | ICD-10-CM | POA: Diagnosis not present

## 2016-03-21 DIAGNOSIS — F5102 Adjustment insomnia: Secondary | ICD-10-CM | POA: Diagnosis not present

## 2016-03-21 MED ORDER — ARIPIPRAZOLE 5 MG PO TABS: 5.0000 mg | ORAL_TABLET | Freq: Every day | ORAL | 0 refills | Status: DC

## 2016-03-21 NOTE — Patient Instructions (Signed)
Lower the dose of  ativan to 1 and half tablet for next 10 days and then lower to 1 tablet a night .

## 2016-03-21 NOTE — Progress Notes (Signed)
Patient ID: Danielle Boyd, female   DOB: 06-03-1955, 61 y.o.   MRN: [id number]  [facility] Outpatient Visit  Patient Identification: Danielle Boyd MRN:  [id number] Date of Evaluation:  03/21/2016 Referral Source: Self and Dr. Alton Revere (Neurology) Chief Complaint:   Chief Complaint    Follow-up     Visit Diagnosis:    ICD-9-CM ICD-10-CM   1. Moderate episode of recurrent major depressive disorder (HCC) 296.32 F33.1   2. GAD (generalized anxiety disorder) 300.02 F41.1   3. Adjustment insomnia 307.41 F51.02   4. Fibromyalgia 729.1 M79.7    Diagnosis:   Patient Active Problem List   Diagnosis Date Noted  . Benign hypertension [I10] 04/04/2015  . Adult BMI 30+ [IMO0002] 03/27/2015  . Acid reflux [K21.9] 03/27/2015  . 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 Snores [R06.83] 03/06/2015  . Restless leg [G25.81] 03/06/2015  . Pins and needles sensation [R20.2] 03/06/2015  . Leg pain [M79.606] 03/06/2015  . Cannot sleep [G47.00] 03/06/2015  . 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 BP (high blood pressure) [I10] 03/06/2015  . HLD (hyperlipidemia) [E78.5] 03/06/2015  . Fatigue [R53.83] 03/06/2015  . Benign essential tremor [G25.0] 03/06/2015  . Clinical depression [F32.9] 03/06/2015  . 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 Leg paresthesia [R20.2] 03/06/2015  . Fibromyalgia [M79.7] 11/28/2014  . Low back pain [M54.5] 11/28/2014  . History of atypical nevus [Z87.2] 10/12/2012   History of Present Illness:  61 years old currently married Caucasian female initially referred by neurology and herself for management of depression, insomnia and anxiety.    Patient presents for early follow-up as last visit she was an urgent appointment because of feeling of suicidality and depression of talking to husband all the  guns were locked. I asked her to cut down the Paxil because it was not helping now she the dose of 10 mg. We started Abilify that has helped the depression she is not as depressed as she has been before  He talked in detail about Inderal and also Ativan and that may be causing or contributing to the depression and she is willing to cut it down the Ativan slowly he talked about sleep hygiene.  Has supportive husband whom I talked last time.   Sad but better  Denies using alcohol  Modifying factors: husband   Anxiety fluctuates Insomnia fluctuates unless she takes her Ativan which is a high dose and she understands No associated psychotic symptoms.   Severity of depression: 4/10. 10 being no depression.      Past Medical History:  Past Medical History:  Diagnosis Date  . Fatigue    History reviewed. No pertinent surgical history. Family History: History reviewed. No pertinent family history. Social History:   Social History   Social History  . Marital status: Married    Spouse name: N/A  . Number of children: N/A  . Years of education: N/A   Social History Main Topics  . Smoking status: Never Smoker  . Smokeless tobacco: Never Used  . Alcohol use 0.6 oz/week    1 Cans of beer per week  . Drug use: No  . Sexual activity: Not Currently   Other Topics Concern  . None   Social History Narrative  . None     Psychiatric Specialty Exam: HPI  Review of Systems  Constitutional: Positive for malaise/fatigue. Negative for fever.  Cardiovascular: Negative for chest pain.  Musculoskeletal: Positive for myalgias.  Skin: Negative for itching and rash.  Neurological: Negative for tingling.  Psychiatric/Behavioral: Positive for depression. Negative for substance abuse and suicidal ideas.    There were no vitals taken for this visit.There is no height or weight on file to calculate BMI.  General Appearance: Casual  Eye Contact:  Fair  Speech:  Slow  Volume:  Decreased   Mood: dysphoric but improved since last 3 days  Affect:  Somewhat better  Thought Process:  Coherent  Orientation:  Full (Time, Place, and Person)  Thought Content:  Rumination  Suicidal Thoughts:  No  Homicidal Thoughts:  No  Memory:  Immediate;   Fair Recent;   Fair  Judgement:  Fair  Insight:  Shallow  Psychomotor Activity:  Decreased  Concentration:  Fair  Recall:  [employer]  Fund of Knowledge:Fair  Language: Fair  Akathisia:  Negative  Handed:  Right  AIMS (if indicated):    Assets:  Desire for Improvement  ADL's:  Intact  Cognition: WNL  Sleep:  poor     Allergies:   Allergies  Allergen Reactions  . Duloxetine Other (See Comments)    Abdominal pain  . Milnacipran Nausea Only  . Nsaids Nausea Only     GI upse  . Pramipexole Other (See Comments)    Insomnia  . Ropinirole Nausea Only  . Tolmetin Nausea Only        Current Medications: Current Outpatient Prescriptions  Medication Sig Dispense Refill  . ARIPiprazole (ABILIFY) 5 MG tablet Take 1 tablet (5 mg total) by mouth daily. 30 tablet 0  . gabapentin (NEURONTIN) 400 MG capsule TAKE ONE CAPSULE BY MOUTH IN THE MORNING, AND 3 OR 4 AT BEDTIME  6  . HYDROcodone-acetaminophen (NORCO) 10-325 MG tablet Take by mouth.    Marland Kitchen. LORazepam (ATIVAN) 2 MG tablet TAKE 1 TO 2 TABLETS AT BEDTIME  2  . NEUPRO 2 MG/24HR APPLY 1 PATCH DAILY AS DIRECTED.  4  . nitrofurantoin, macrocrystal-monohydrate, (MACROBID) 100 MG capsule Take 100 mg by mouth.    Marland Kitchen. PARoxetine (PAXIL) 20 MG tablet Take 0.5 tablets (10 mg total) by mouth daily. 15 tablet 0  . propranolol ER (INDERAL LA) 160 MG SR capsule Take 160 mg by mouth daily.  5  . rivaroxaban (XARELTO) 20 MG TABS tablet Take 20 mg by mouth.    . simvastatin (ZOCOR) 20 MG tablet Take 20 mg by mouth daily.  3   No current facility-administered medications for this visit.       Treatment Plan Summary: Medication management and Plan as follows  1. MDD: severe: somewhat better then in 3  days. Continue abilify 5mg . paxil 10mg  will not lower paxil further for now as we work on ativan to be lowered   2. GAD: fluctuates. Cutting down ativan to 1.5 tablet of 2mg  taking for next 10 days then take 1 tablet qhs till next visit.  Consider to work on keeping inderal dose low as well. She was on 2 now taking one tablet.   Tremors are not worse.   Provided supportive therapy discussing side effects and concerns follow up in 3-4 weeks or earlier if needed consider psychotherapy. Call in earlier if needed  [facility] Hospital[facility]HTAR, Emma-Lee Oddo 3/9/201812:48 PM

## 2016-03-24 ENCOUNTER — Ambulatory Visit (HOSPITAL_COMMUNITY): Payer: BLUE CROSS/BLUE SHIELD | Admitting: Licensed Clinical Social Worker

## 2016-04-21 ENCOUNTER — Ambulatory Visit (INDEPENDENT_AMBULATORY_CARE_PROVIDER_SITE_OTHER): Payer: BLUE CROSS/BLUE SHIELD | Admitting: Licensed Clinical Social Worker

## 2016-04-21 DIAGNOSIS — G4700 Insomnia, unspecified: Secondary | ICD-10-CM

## 2016-04-21 DIAGNOSIS — F411 Generalized anxiety disorder: Secondary | ICD-10-CM

## 2016-04-21 DIAGNOSIS — F331 Major depressive disorder, recurrent, moderate: Secondary | ICD-10-CM
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
## 2016-04-23 ENCOUNTER — Encounter (HOSPITAL_COMMUNITY): Payer: Self-pay | Admitting: Psychiatry

## 2016-04-23 ENCOUNTER — Ambulatory Visit (INDEPENDENT_AMBULATORY_CARE_PROVIDER_SITE_OTHER): Payer: BLUE CROSS/BLUE SHIELD | Admitting: Psychiatry

## 2016-04-23 VITALS — BP 126/78 | HR 72 | Resp 16 | Ht 65.5 in | Wt 202.0 lb

## 2016-04-23 DIAGNOSIS — Z79899 Other long term (current) drug therapy: Secondary | ICD-10-CM | POA: Diagnosis not present

## 2016-04-23 DIAGNOSIS — G4700 Insomnia, unspecified: Secondary | ICD-10-CM | POA: Diagnosis not present

## 2016-04-23 DIAGNOSIS — F411 Generalized anxiety disorder: Secondary | ICD-10-CM | POA: Diagnosis not present

## 2016-04-23 DIAGNOSIS — F331 Major depressive disorder, recurrent, moderate: Secondary | ICD-10-CM

## 2016-04-23 DIAGNOSIS — M797 Fibromyalgia: Secondary | ICD-10-CM | POA: Diagnosis not present

## 2016-04-23 MED ORDER — ARIPIPRAZOLE 5 MG PO TABS: 5.0000 mg | ORAL_TABLET | Freq: Every day | ORAL | 0 refills | Status: DC

## 2016-04-23 NOTE — Progress Notes (Signed)
Patient ID: Danielle Boyd, female   DOB: [birth year], 61 y.o.   MRN: [id number]  Psychiatric [facility] Outpatient Visit  Patient Identification: Danielle Boyd MRN:  [id number] Date of Evaluation:  04/23/2016 Referral Source: Self and Dr. Alton Revere (Neurology) Chief Complaint:   Chief Complaint    Follow-up     Visit Diagnosis:    ICD-9-CM ICD-10-CM   1. Moderate episode of recurrent major depressive disorder (HCC) 296.32 F33.1   2. GAD (generalized anxiety disorder) 300.02 F41.1   3. Insomnia, unspecified type 780.52 G47.00   4. Fibromyalgia 729.1 M79.7    Diagnosis:   Patient Active Problem List   Diagnosis Date Noted  . Benign hypertension [I10] 04/04/2015  . Adult BMI 30+ [IMO0002] 03/27/2015  . Acid reflux [K21.9] 03/27/2015  . 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 Snores [R06.83] 03/06/2015  . Restless leg [G25.81] 03/06/2015  . Pins and needles sensation [R20.2] 03/06/2015  . Leg pain [M79.606] 03/06/2015  . Cannot sleep [G47.00] 03/06/2015  . 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 BP (high blood pressure) [I10] 03/06/2015  . HLD (hyperlipidemia) [E78.5] 03/06/2015  . Fatigue [R53.83] 03/06/2015  . Benign essential tremor [G25.0] 03/06/2015  . Clinical depression [F32.9] 03/06/2015  . 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 Leg paresthesia [R20.2] 03/06/2015  . Fibromyalgia [M79.7] 11/28/2014  . Low back pain [M54.5] 11/28/2014  . History of atypical nevus [Z87.2] 10/12/2012   History of Present Illness:  61 years old currently married Caucasian female initially referred by neurology and herself for management of depression, insomnia and anxiety.    Patient presents for follow-up 1-2 months ago she was depressed and wonders that she came in with depression and hopelessness we have cutting down the  medication considering she was on high doses of Vicodin and Ativan she is now lowered it down to 1 tablet at night only she is feeling more alert she feels her depression is improving she is able to sleep and also not craving the Ativan.  Husband is supportive. Patient is less sad and feels she is less foggy. Abilify is helped her depression and mood swings as well Tremors not worsened  Patient presents for Has supportive husband whom I talked last time.    Denies using alcohol  Modifying factors: husband  Severity of depression: 4/10/. 10 being no depression    Past Medical History:  Past Medical History:  Diagnosis Date  . Fatigue    History reviewed. No pertinent surgical history. Family History: History reviewed. No pertinent family history. Social History:   Social History   Social History  . Marital status: Married    Spouse name: N/A  . Number of children: N/A  . Years of education: N/A   Social History Main Topics  . Smoking status: Never Smoker  . Smokeless tobacco: Never Used  . Alcohol use 0.6 oz/week    1 Cans of beer per week  . Drug use: No  . Sexual activity: Not Currently   Other Topics Concern  . None   Social History Narrative  . None     Psychiatric Specialty Exam: HPI  Review of Systems  Constitutional: Positive for malaise/fatigue. Negative for fever.  Cardiovascular: Negative for palpitations.  Musculoskeletal: Positive for myalgias.  Skin: Negative for itching and rash.  Neurological: Negative for tingling.  Psychiatric/Behavioral: Negative for substance abuse and suicidal ideas. The  patient is nervous/anxious.     Blood pressure 126/78, pulse 72, resp. rate 16, height 5' 5.5" (1.664 m), weight 202 lb (91.6 kg), SpO2 95 %.Body mass index is 33.1 kg/m.  General Appearance: Casual  Eye Contact:  Fair  Speech:  Slow  Volume:  Decreased  Mood: improved. Less depressed   Affect:  Somewhat better  Thought Process:  Coherent   Orientation:  Full (Time, Place, and Person)  Thought Content:  Rumination  Suicidal Thoughts:  No  Homicidal Thoughts:  No  Memory:  Immediate;   Fair Recent;   Fair  Judgement:  Fair  Insight:  Shallow  Psychomotor Activity:  Decreased  Concentration:  Fair  Recall:  [employer] of Knowledge:Fair  Language: Fair  Akathisia:  Negative  Handed:  Right  AIMS (if indicated):    Assets:  Desire for Improvement  ADL's:  Intact  Cognition: WNL  Sleep:  poor     Allergies:   Allergies  Allergen Reactions  . Duloxetine Other (See Comments)    Abdominal pain  . Milnacipran Nausea Only  . Nsaids Nausea Only     GI upse  . Pramipexole Other (See Comments)    Insomnia  . Ropinirole Nausea Only  . Tolmetin Nausea Only        Current Medications: Current Outpatient Prescriptions  Medication Sig Dispense Refill  . ARIPiprazole (ABILIFY) 5 MG tablet Take 1 tablet (5 mg total) by mouth daily. 30 tablet 0  . ciprofloxacin (CIPRO) 500 MG tablet     . gabapentin (NEURONTIN) 400 MG capsule TAKE ONE CAPSULE BY MOUTH IN THE MORNING, AND 3 OR 4 AT BEDTIME  6  . HYDROcodone-acetaminophen (NORCO) 10-325 MG tablet Take by mouth.    Marland Kitchen LORazepam (ATIVAN) 2 MG tablet TAKE 1 TO 2 TABLETS AT BEDTIME  2  . NEUPRO 2 MG/24HR APPLY 1 PATCH DAILY AS DIRECTED.  4  . nitrofurantoin, macrocrystal-monohydrate, (MACROBID) 100 MG capsule Take 100 mg by mouth.    Marland Kitchen PARoxetine (PAXIL) 20 MG tablet Take 0.5 tablets (10 mg total) by mouth daily. 15 tablet 0  . propranolol ER (INDERAL LA) 160 MG SR capsule Take 160 mg by mouth daily.  5  . rivaroxaban (XARELTO) 20 MG TABS tablet Take 20 mg by mouth.    . simvastatin (ZOCOR) 20 MG tablet Take 20 mg by mouth daily.  3   No current facility-administered medications for this visit.       Treatment Plan Summary: Medication management and Plan as follows  1. MDD: Improving. Continue paxil . Has cut down ativan now to  at night only. Will work on  cutting down to 0.5mg  starting tomorrow night   2. GAD: not worse. Worries about cutting ativan further. But will work on lowering the dose as above Continue gabapentin prescribed by providers.  Also on one vicodin now instead of 4.   Sleep not worse actually felt more refresh during the day with lower doses as above. Worried about knee pain and possible pending surgery  Tremors are not worse.   Primary square therapy reviewed side effects patient is satisfied with the current dosages being reduced and it is affecting her alertness and helping her fogginess and depression FU 4 weeks   Kahli Fitzgerald 4/11/20189:52 AM

## 2016-05-14 ENCOUNTER — Encounter (HOSPITAL_COMMUNITY): Payer: Self-pay | Admitting: Psychiatry

## 2016-05-14 ENCOUNTER — Ambulatory Visit (INDEPENDENT_AMBULATORY_CARE_PROVIDER_SITE_OTHER): Payer: BLUE CROSS/BLUE SHIELD | Admitting: Psychiatry

## 2016-05-14 VITALS — BP 130/80 | HR 64 | Resp 18 | Ht 65.5 in | Wt 196.0 lb

## 2016-05-14 DIAGNOSIS — G4700 Insomnia, unspecified: Secondary | ICD-10-CM | POA: Diagnosis not present

## 2016-05-14 DIAGNOSIS — F411 Generalized anxiety disorder: Secondary | ICD-10-CM

## 2016-05-14 DIAGNOSIS — F331 Major depressive disorder, recurrent, moderate: Secondary | ICD-10-CM

## 2016-05-14 DIAGNOSIS — Z79899 Other long term (current) drug therapy: Secondary | ICD-10-CM

## 2016-05-14 DIAGNOSIS — M797 Fibromyalgia: Secondary | ICD-10-CM

## 2016-05-14 MED ORDER — PAROXETINE HCL 20 MG PO TABS: 10.0000 mg | ORAL_TABLET | Freq: Every day | ORAL | 1 refills | Status: DC

## 2016-05-14 MED ORDER — ARIPIPRAZOLE 5 MG PO TABS: 5.0000 mg | ORAL_TABLET | Freq: Every day | ORAL | 2 refills | Status: DC

## 2016-05-14 NOTE — Progress Notes (Signed)
Patient ID: Danielle Boyd, female   DOB: 08-16-1955, 61 y.o.   MRN: [id number]  Psychiatric [facility] Outpatient Visit  Patient Identification: Danielle Boyd MRN:  [id number] Date of Evaluation:  05/14/2016 Referral Source: Self and Dr. Alton Revere (Neurology) Chief Complaint:   Chief Complaint    Follow-up     Visit Diagnosis:    ICD-9-CM ICD-10-CM   1. Moderate episode of recurrent major depressive disorder (HCC) 296.32 F33.1   2. Fibromyalgia 729.1 M79.7   3. Insomnia, unspecified type 780.52 G47.00    Diagnosis:   Patient Active Problem List   Diagnosis Date Noted  . Benign hypertension [I10] 04/04/2015  . Adult BMI 30+ [IMO0002] 03/27/2015  . Acid reflux [K21.9] 03/27/2015  . 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 Snores [R06.83] 03/06/2015  . Restless leg [G25.81] 03/06/2015  . Pins and needles sensation [R20.2] 03/06/2015  . Leg pain [M79.606] 03/06/2015  . Cannot sleep [G47.00] 03/06/2015  . 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 BP (high blood pressure) [I10] 03/06/2015  . HLD (hyperlipidemia) [E78.5] 03/06/2015  . Fatigue [R53.83] 03/06/2015  . Benign essential tremor [G25.0] 03/06/2015  . Clinical depression [F32.9] 03/06/2015  . 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 Leg paresthesia [R20.2] 03/06/2015  . Fibromyalgia [M79.7] 11/28/2014  . Low back pain [M54.5] 11/28/2014  . History of atypical nevus [Z87.2] 10/12/2012   History of Present Illness:  61 years old currently married Caucasian female initially referred by neurology and herself for management of depression, insomnia and anxiety.  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 Fatigue    No past surgical history on file. Family History: No family history on file. Social History:   Social History   Social History  . Marital status: Married    Spouse name: N/A  . Number of children: N/A  . Years of education: N/A   Social History Main Topics  . Smoking status: Never Smoker  . Smokeless tobacco: Never Used  . Alcohol use 0.6 oz/week    1 Cans of beer per week  . Drug use: No  . Sexual activity: Not Currently   Other Topics Concern  . None   Social History Narrative  . None     Psychiatric Specialty Exam: HPI  Review of Systems  Constitutional: Negative for fever.  Cardiovascular: Negative for chest pain.  Musculoskeletal: Positive for myalgias.  Skin: Negative for itching and rash.  Neurological: Negative for tingling.  Psychiatric/Behavioral: Negative for substance abuse and suicidal ideas.    Blood pressure 130/80, pulse 64, resp. rate 18, height 5' 5.5" (1.664 m), weight 196 lb (88.9 kg), SpO2 95 %.Body mass index is 32.12 kg/m.  General Appearance: Casual  Eye Contact:  Fair  Speech:  Slow  Volume:  Decreased  Mood: fair  Affect:  Improved. Reactive some  Thought Process:  Coherent  Orientation:  Full (Time, Place, and Person)  Thought Content:  Rumination  Suicidal Thoughts:  No  Homicidal Thoughts:  No  Memory:  Immediate;   Fair Recent;   Fair  Judgement:  Fair  Insight:  Shallow  Psychomotor Activity:  Decreased  Concentration:  Fair  Recall:  [employer] of Knowledge:Fair  Language: Fair  Akathisia:  Negative  Handed:  Right  AIMS (if indicated):    Assets:  Desire for Improvement  ADL's:  Intact  Cognition: WNL  Sleep:  poor      Allergies:   Allergies  Allergen Reactions  . Duloxetine Other (See Comments)    Abdominal pain  . Milnacipran Nausea Only  . Nsaids Nausea Only     GI upse  . Pramipexole Other (See Comments)    Insomnia  . Ropinirole Nausea Only  . Tolmetin Nausea Only        Current Medications: Current Outpatient Prescriptions  Medication Sig Dispense Refill  . ARIPiprazole (ABILIFY) 5 MG tablet Take 1 tablet (5 mg total) by mouth daily. 30 tablet 2  . ciprofloxacin (CIPRO) 500 MG tablet     . gabapentin (NEURONTIN) 400 MG capsule TAKE ONE CAPSULE BY MOUTH IN THE MORNING, AND 3 OR 4 AT BEDTIME  6  . HYDROcodone-acetaminophen (NORCO) 10-325 MG tablet Take by mouth.    Marland Kitchen LORazepam (ATIVAN) 2 MG tablet TAKE 1 TO 2 TABLETS AT BEDTIME  2  . NEUPRO 2 MG/24HR APPLY 1 PATCH DAILY AS DIRECTED.  4  . nitrofurantoin, macrocrystal-monohydrate, (MACROBID) 100 MG capsule Take 100 mg by mouth.    Marland Kitchen PARoxetine (PAXIL) 20 MG tablet Take 0.5 tablets (10 mg total) by mouth daily. 15 tablet 1  . propranolol ER (INDERAL LA) 160 MG SR capsule Take 160 mg by mouth daily.  5  . simvastatin (ZOCOR) 20 MG tablet Take 20 mg by mouth daily.  3   No current facility-administered medications for this visit.       Treatment Plan Summary: Medication management and Plan as follows  1. MDD: improving. Continue low dose paxil  2. GAD: not worse. Now on  ativan. Plan is to cut down to 0,.5mg  in next 1 month or so Continue gabapentin prescribed by providers.  Cautioned not to increase vicodin.  3. Insomnia: not worse. Reviewed sleep hygiene Provided supportive therapy discussed the side effects follow up in 3 months. Prescriptions  Danielle Boyd 5/2/20189:02 AM

## 2016-05-27 ENCOUNTER — Ambulatory Visit (HOSPITAL_COMMUNITY): Payer: BLUE CROSS/BLUE SHIELD | Admitting: Psychiatry

## 2016-06-10 ENCOUNTER — Other Ambulatory Visit (HOSPITAL_COMMUNITY): Payer: Self-pay | Admitting: *Deleted

## 2016-06-10 NOTE — Telephone Encounter (Signed)
Received telephone call from [facility] requesting a 90 day order for Abilify. Last refill was sent to pharmacy on 05/14/16. Please advise.

## 2016-06-10 NOTE — Telephone Encounter (Signed)
Can get 90 day supply when due. Explain patient of risk of having that amount at home.

## 2016-06-12 MED ORDER — ARIPIPRAZOLE 5 MG PO TABS: 5.0000 mg | ORAL_TABLET | Freq: Every day | ORAL | 0 refills | Status: DC

## 2016-06-12 NOTE — Telephone Encounter (Signed)
Per Dr. Gilmore LarocheAkhtar, refill is authorize for a 90 day order for Abilify 5mg . A new prescription was escribed to pharmacy. Patient's next apt is schedule on 08/14/16. Called and informed patient of refill status. Patient show understanding.

## 2016-07-10 ENCOUNTER — Other Ambulatory Visit (HOSPITAL_COMMUNITY): Payer: Self-pay | Admitting: Psychiatry

## 2016-07-10 NOTE — Telephone Encounter (Signed)
Received fax from [facility] requesting a refill for Paxil. Per Dr. Gilmore LarocheAkhtar, refill request is denied. Refill has been request too early. Next refill is not due until 07/14/16. Nothing further is need at this time.

## 2016-07-11 ENCOUNTER — Other Ambulatory Visit (HOSPITAL_COMMUNITY): Payer: Self-pay | Admitting: Psychiatry

## 2016-07-15 NOTE — Telephone Encounter (Signed)
Medication refill- received fax from [facility] requesting a refill for Paxil. Per Dr. Gilmore LarocheAkhtar, refill is authorize for Paxil 20mg , #15. Rx was sent to pharmacy. Pt's next apt is schedule on 08/14/16. Lvm informing pt of refill status.

## 2016-08-14 ENCOUNTER — Ambulatory Visit (HOSPITAL_COMMUNITY): Payer: BLUE CROSS/BLUE SHIELD | Admitting: Psychiatry

## 2016-08-19 ENCOUNTER — Other Ambulatory Visit (HOSPITAL_COMMUNITY): Payer: Self-pay | Admitting: Psychiatry

## 2016-08-20 NOTE — Telephone Encounter (Signed)
Medication refill- received fax from [facility] requesting a refill for Paxil. Per dr. Gilmore LarocheAkhtar, refill request is authorize for Paxil 20mg , #15. rx was sent to pharmacy. Pt's next apt is schedule on 08/22/16. Lvm informing pt of refill status.

## 2016-08-22 ENCOUNTER — Ambulatory Visit (INDEPENDENT_AMBULATORY_CARE_PROVIDER_SITE_OTHER): Payer: BLUE CROSS/BLUE SHIELD | Admitting: Psychiatry

## 2016-08-22 ENCOUNTER — Encounter (HOSPITAL_COMMUNITY): Payer: Self-pay | Admitting: Psychiatry

## 2016-08-22 DIAGNOSIS — F331 Major depressive disorder, recurrent, moderate: Secondary | ICD-10-CM | POA: Diagnosis not present

## 2016-08-22 DIAGNOSIS — M791 Myalgia: Secondary | ICD-10-CM

## 2016-08-22 DIAGNOSIS — M797 Fibromyalgia: Secondary | ICD-10-CM

## 2016-08-22 DIAGNOSIS — F411 Generalized anxiety disorder: Secondary | ICD-10-CM

## 2016-08-22 DIAGNOSIS — G4700 Insomnia, unspecified: Secondary | ICD-10-CM

## 2016-08-22 MED ORDER — PAROXETINE HCL 20 MG PO TABS
10.0000 mg | ORAL_TABLET | Freq: Every day | ORAL | 2 refills | Status: DC
Start: 2016-08-22 — End: 2016-11-14

## 2016-08-22 MED ORDER — ARIPIPRAZOLE 5 MG PO TABS: 5.0000 mg | ORAL_TABLET | Freq: Every day | ORAL | 0 refills | Status: DC

## 2016-08-22 NOTE — Progress Notes (Signed)
Patient ID: Charlott Hollererri Fregia, female   DOB: 11-10-1955, 61 y.o.   MRN: [id number]  [facility]  Patient Identification: Charlott Hollererri Jipson MRN:  [id number] Date of Evaluation:  08/22/2016 Referral Source: Self and Dr. Alton RevereFeraru (Neurology) Chief Complaint:   Chief Complaint    Follow-up     Visit Diagnosis:    ICD-10-CM   1. 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 Fibromyalgia M79.7   3. GAD (generalized anxiety disorder) F41.1    Diagnosis:   Patient Active Problem List   Diagnosis Date Noted  . Benign hypertension [I10] 04/04/2015  . Adult BMI 30+ [IMO0002] 03/27/2015  . Acid reflux [K21.9] 03/27/2015  . 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 Snores [R06.83] 03/06/2015  . Restless leg [G25.81] 03/06/2015  . Pins and needles sensation [R20.2] 03/06/2015  . Leg pain [M79.606] 03/06/2015  . Cannot sleep [G47.00] 03/06/2015  . 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 BP (high blood pressure) [I10] 03/06/2015  . HLD (hyperlipidemia) [E78.5] 03/06/2015  . Fatigue [R53.83] 03/06/2015  . Benign essential tremor [G25.0] 03/06/2015  . Clinical depression [F32.9] 03/06/2015  . 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 Leg paresthesia [R20.2] 03/06/2015  . Fibromyalgia [M79.7] 11/28/2014  . Low back pain [M54.5] 11/28/2014  . History of atypical nevus [Z87.2] 10/12/2012   History of Present Illness:  61 years old currently married Caucasian female initially referred by neurology and herself for management of depression, insomnia and anxiety.  Patient's cut down her Ativan and also her pain medication she is feeling less foggy she continues to do well. She is on gabapentin. Mood wise she is e not hopeless she is trying to "shopping and visit friends and that is helping as  well denies any alcohol use.  Severity of depression: improved. 7/10. 10 being no depression Sleep fair  Past Medical History:  Past Medical History:  Diagnosis Date  . Fatigue    History reviewed. No pertinent surgical history. Family History: History reviewed. No pertinent family history. Social History:   Social History   Social History  . Marital status: Married    Spouse name: N/A  . Number of children: N/A  . Years of education: N/A   Social History Main Topics  . Smoking status: Never Smoker  . Smokeless tobacco: Never Used  . Alcohol use 0.6 oz/week    1 Cans of beer per week  . Drug use: No  . Sexual activity: Not Currently   Other Topics Concern  . None   Social History Narrative  . None     Psychiatric Specialty Exam: HPI  Review of Systems  Constitutional: Negative for fever.  Cardiovascular: Negative for palpitations.  Musculoskeletal: Positive for myalgias.  Skin: Negative for itching and rash.  Neurological: Negative for tingling.  Psychiatric/Behavioral: Negative for depression, substance abuse and suicidal ideas.    There were no vitals taken for this visit.There is no height or weight on file to calculate BMI.  General Appearance: Casual  Eye Contact:  Fair  Speech:  Slow  Volume:  Decreased  Mood: euthymic  Affect:  reactive  Thought Process:  Coherent  Orientation:  Full (Time, Place, and Person)  Thought Content:  Rumination  Suicidal Thoughts:  No  Homicidal Thoughts:  No  Memory:  Immediate;   Fair Recent;   Fair  Judgement:  Fair  Insight:  Shallow  Psychomotor Activity:  Decreased  Concentration:  Fair  Recall:  [employer] of Knowledge:Fair  Language: Fair  Akathisia:  Negative  Handed:  Right  AIMS (if indicated):    Assets:  Desire for Improvement  ADL's:  Intact  Cognition: WNL  Sleep:  poor     Allergies:   Allergies  Allergen Reactions  . Duloxetine Other (See Comments)    Abdominal pain  . Milnacipran  Nausea Only  . Nsaids Nausea Only     GI upse  . Pramipexole Other (See Comments)    Insomnia  . Ropinirole Nausea Only  . Tolmetin Nausea Only        Current Medications: Current Outpatient Prescriptions  Medication Sig Dispense Refill  . ARIPiprazole (ABILIFY) 5 MG tablet Take 1 tablet (5 mg total) by mouth daily. 90 tablet 0  . ciprofloxacin (CIPRO) 500 MG tablet     . gabapentin (NEURONTIN) 400 MG capsule TAKE ONE CAPSULE BY MOUTH IN THE MORNING, AND 3 OR 4 AT BEDTIME  6  . HYDROcodone-acetaminophen (NORCO) 10-325 MG tablet Take by mouth.    Marland Kitchen LORazepam (ATIVAN) 2 MG tablet TAKE 1 TO 2 TABLETS AT BEDTIME  2  . NEUPRO 2 MG/24HR APPLY 1 PATCH DAILY AS DIRECTED.  4  . PARoxetine (PAXIL) 20 MG tablet Take 0.5 tablets (10 mg total) by mouth daily. 15 tablet 2  . propranolol ER (INDERAL LA) 160 MG SR capsule Take 160 mg by mouth daily.  5  . simvastatin (ZOCOR) 20 MG tablet Take 20 mg by mouth daily.  3   No current facility-administered medications for this visit.       Treatment Plan Summary: Medication management and Plan as follows  1. MDD: improved. Continue paxil and abilify 2. GAD: improved. On low dose ativan now Continue gabapentin prescribed by providers.  Cautioned not to increase vicodin.  3. Insomnia: fluctuates not worse. Reviewed sleep hygien Provided supportive therapy discussed the side effects follow up in 3 months. Prescriptions  Mischa Pollard 8/10/201810:34 AM

## 2016-11-14 ENCOUNTER — Ambulatory Visit (INDEPENDENT_AMBULATORY_CARE_PROVIDER_SITE_OTHER): Payer: BLUE CROSS/BLUE SHIELD | Admitting: Psychiatry

## 2016-11-14 ENCOUNTER — Encounter (HOSPITAL_COMMUNITY): Payer: Self-pay | Admitting: Psychiatry

## 2016-11-14 VITALS — BP 126/76 | HR 62 | Resp 16 | Ht 65.5 in | Wt 190.0 lb

## 2016-11-14 DIAGNOSIS — F331 Major depressive disorder, recurrent, moderate: Secondary | ICD-10-CM | POA: Diagnosis not present

## 2016-11-14 DIAGNOSIS — M791 Myalgia, unspecified site: Secondary | ICD-10-CM | POA: Diagnosis not present

## 2016-11-14 DIAGNOSIS — M797 Fibromyalgia: Secondary | ICD-10-CM

## 2016-11-14 DIAGNOSIS — G4700 Insomnia, unspecified: Secondary | ICD-10-CM | POA: Diagnosis not present

## 2016-11-14 DIAGNOSIS — F411 Generalized anxiety disorder: Secondary | ICD-10-CM | POA: Diagnosis not present

## 2016-11-14 MED ORDER — PAROXETINE HCL 20 MG PO TABS: 10.0000 mg | ORAL_TABLET | Freq: Every day | ORAL | 2 refills | Status: DC

## 2016-11-14 MED ORDER — ARIPIPRAZOLE 5 MG PO TABS: 5.0000 mg | ORAL_TABLET | Freq: Every day | ORAL | 0 refills | Status: DC

## 2016-11-14 NOTE — Progress Notes (Signed)
Patient ID: Danielle Boyd, female   DOB: [birth year], 61 y.o.   MRN: [id number]  Psychiatric [facility] Outpatient Visit  Patient Identification: Danielle Boyd MRN:  [id number] Date of Evaluation:  11/14/2016 Referral Source: Self and Dr. Alton Revere (Neurology) Chief Complaint:   Chief Complaint    Follow-up     Visit Diagnosis:    ICD-10-CM   1. 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 Fibromyalgia M79.7   3. GAD (generalized anxiety disorder) F41.1   4. Insomnia, unspecified type G47.00    Diagnosis:   Patient Active Problem List   Diagnosis Date Noted  . Benign hypertension [I10] 04/04/2015  . Adult BMI 30+ [IMO0002] 03/27/2015  . Acid reflux [K21.9] 03/27/2015  . 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 Snores [R06.83] 03/06/2015  . Restless leg [G25.81] 03/06/2015  . Pins and needles sensation [R20.2] 03/06/2015  . Leg pain [M79.606] 03/06/2015  . Cannot sleep [G47.00] 03/06/2015  . 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 BP (high blood pressure) [I10] 03/06/2015  . HLD (hyperlipidemia) [E78.5] 03/06/2015  . Fatigue [R53.83] 03/06/2015  . Benign essential tremor [G25.0] 03/06/2015  . Clinical depression [F32.9] 03/06/2015  . 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 Leg paresthesia [R20.2] 03/06/2015  . Fibromyalgia [M79.7] 11/28/2014  . Low back pain [M54.5] 11/28/2014  . History of atypical nevus [Z87.898] 10/12/2012   History of Present Illness:  61 years old currently married Caucasian female initially referred by neurology and herself for management of depression, insomnia and anxiety.  She is less foggy since lowered ativan. Mood is fair. Hand tremors are concerning started new medication for it  Denies alcohol use Sleep fair Depression improved Pain effects mood, on  neurontin    Severity of depression: 7.5/10    Past Medical History:  Past Medical History:  Diagnosis Date  . Fatigue    History reviewed. No pertinent surgical history. Family History: History reviewed. No pertinent family history. Social History:   Social History   Social History  . Marital status: Married    Spouse name: N/A  . Number of children: N/A  . Years of education: N/A   Social History Main Topics  . Smoking status: Never Smoker  . Smokeless tobacco: Never Used  . Alcohol use 0.6 oz/week    1 Cans of beer per week  . Drug use: No  . Sexual activity: Not Currently   Other Topics Concern  . None   Social History Narrative  . None     Psychiatric Specialty Exam: HPI  Review of Systems  Constitutional: Negative for fever.  Cardiovascular: Negative for palpitations.  Musculoskeletal: Positive for myalgias.  Skin: Negative for itching and rash.  Neurological: Negative for tingling.  Psychiatric/Behavioral: Negative for depression, substance abuse and suicidal ideas.    Blood pressure 126/76, pulse 62, resp. rate 16, height 5' 5.5" (1.664 m), weight 190 lb (86.2 kg), SpO2 94 %.Body mass index is 31.14 kg/m.  General Appearance: Casual  Eye Contact:  Fair  Speech:  Slow  Volume:  Decreased  Mood: fair  Affect:  Reactive pleasant  Thought Process:  Coherent  Orientation:  Full (Time, Place, and Person)  Thought Content:  Rumination  Suicidal Thoughts:  No  Homicidal Thoughts:  No  Memory:  Immediate;   Fair Recent;   Fair  Judgement:  Fair  Insight:  Shallow  Psychomotor Activity:  Decreased  Concentration:  Fair  Recall:  [employer]  Fund of Knowledge:Fair  Language: Fair  Akathisia:  Negative  Handed:  Right  AIMS (if indicated):    Assets:  Desire for Improvement  ADL's:  Intact  Cognition: WNL  Sleep:  poor     Allergies:   Allergies  Allergen Reactions  . Duloxetine Other (See Comments)    Abdominal pain  . Milnacipran Nausea  Only  . Nsaids Nausea Only     GI upse  . Pramipexole Other (See Comments)    Insomnia  . Ropinirole Nausea Only  . Tolmetin Nausea Only        Current Medications: Current Outpatient Prescriptions  Medication Sig Dispense Refill  . ARIPiprazole (ABILIFY) 5 MG tablet Take 1 tablet (5 mg total) by mouth daily. 90 tablet 0  . ciprofloxacin (CIPRO) 500 MG tablet     . gabapentin (NEURONTIN) 400 MG capsule TAKE ONE CAPSULE BY MOUTH IN THE MORNING, AND 3 OR 4 AT BEDTIME  6  . LORazepam (ATIVAN) 2 MG tablet TAKE 1 TO 2 TABLETS AT BEDTIME  2  . NEUPRO 2 MG/24HR APPLY 1 PATCH DAILY AS DIRECTED.  4  . PARoxetine (PAXIL) 20 MG tablet Take 0.5 tablets (10 mg total) by mouth daily. 15 tablet 2  . primidone (MYSOLINE) 50 MG tablet Take 50 mg by mouth.    . propranolol ER (INDERAL LA) 160 MG SR capsule Take 160 mg by mouth daily.  5  . simvastatin (ZOCOR) 20 MG tablet Take 20 mg by mouth daily.  3   No current facility-administered medications for this visit.       Treatment Plan Summary: Medication management and Plan as follows  1. ZOX:WRUEAVWUDD:improved. Continue abilify, paxil. abilify is small dose. paxil is 10mg  but she may be taking full 20mg . Will call back what is she taking  2. GAD: not worse. Continue low dose ativan Continue gabapentin prescribed by providers.  Cautioned not to increase vicodin.  3. Insomnia: reasonable. Reviewed sleep hygien  FU 2 -3 months Danielle Boyd 11/2/201810:08 AM

## 2017-01-22 ENCOUNTER — Other Ambulatory Visit (HOSPITAL_COMMUNITY): Payer: Self-pay | Admitting: Psychiatry

## 2017-01-26 ENCOUNTER — Telehealth (HOSPITAL_COMMUNITY): Payer: Self-pay

## 2017-01-26 ENCOUNTER — Other Ambulatory Visit (HOSPITAL_COMMUNITY): Payer: Self-pay

## 2017-01-26 MED ORDER — PAROXETINE HCL 20 MG PO TABS: 10.0000 mg | ORAL_TABLET | Freq: Every day | ORAL | 0 refills | Status: DC

## 2017-01-26 NOTE — Telephone Encounter (Signed)
Pharmacy called for a refill on Paxil 20 mg. Sent over a 30 day supply. Patients' last appointment was on 11/14/16, with next appointment on 02/11/17. Nothing further is needed at this time.

## 2017-02-07 IMAGING — US US EXTREM UP*L* COMP
1 series · 11 of 11 positions shown · non-contrast
Comparison: None.

CLINICAL DATA: Redness and swelling in the upper left arm

EXAM:
ULTRASOUND left UPPER EXTREMITY COMPLETE
TECHNIQUE: Ultrasound examination was performed including evaluation of the
muscles, tendons, joint, and adjacent soft tissues.

[Series 1: us extrem up*left* comp · 0.09mm/px · 11 of 11 slices shown]
[im 1/11]
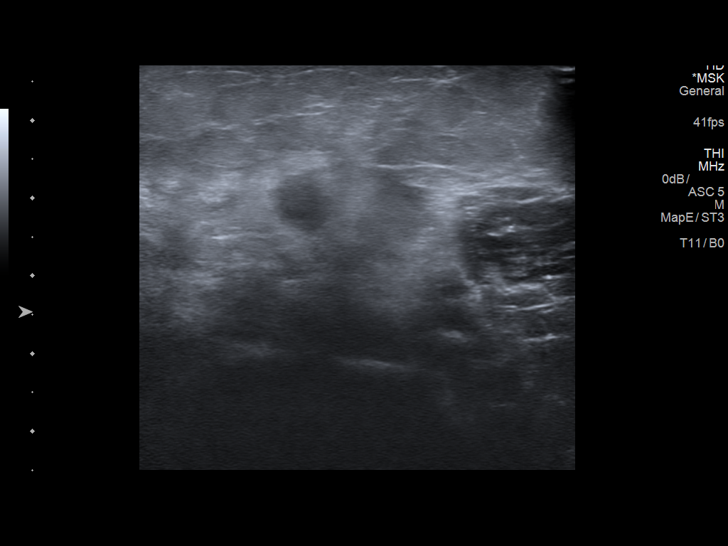
[im 2/11]
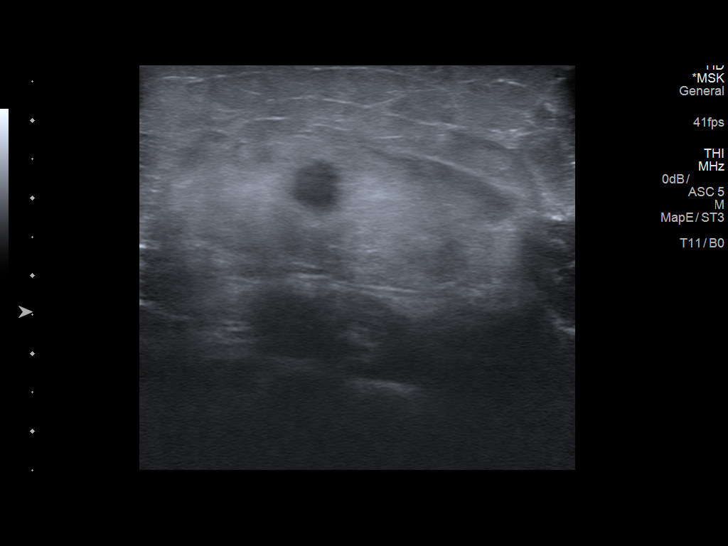
[im 3/11]
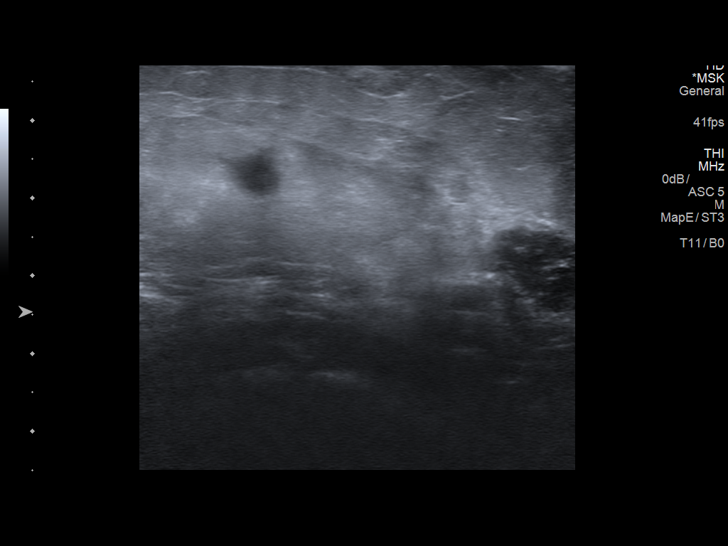
[im 4/11]
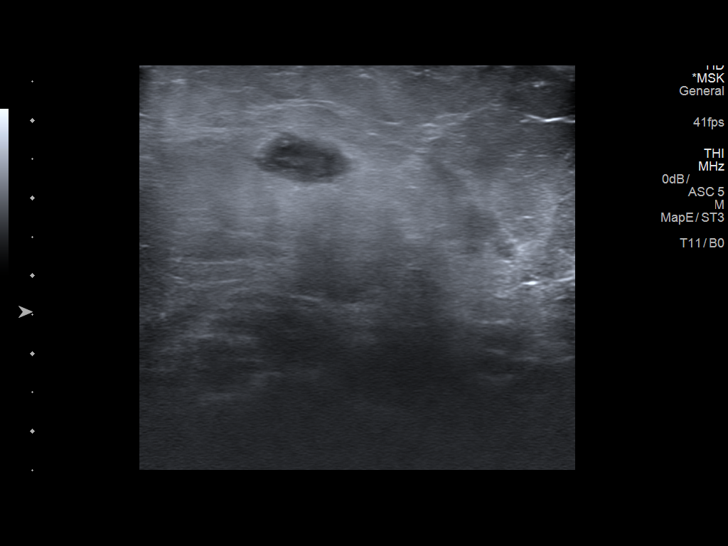
[im 5/11]
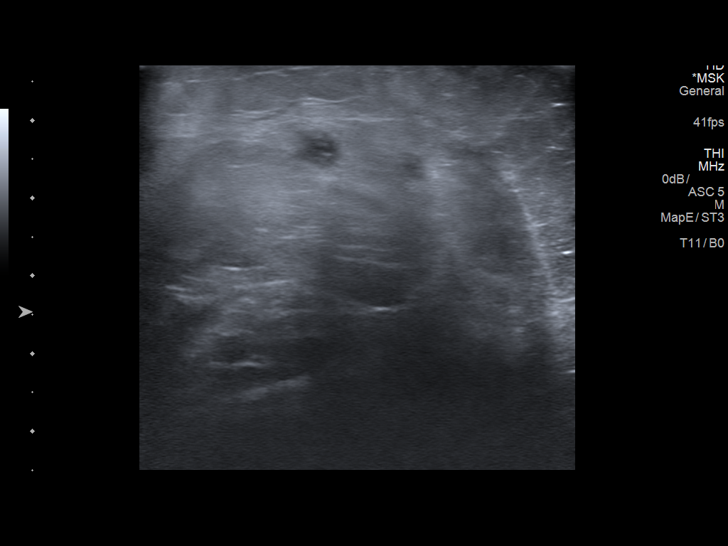
[im 6/11]
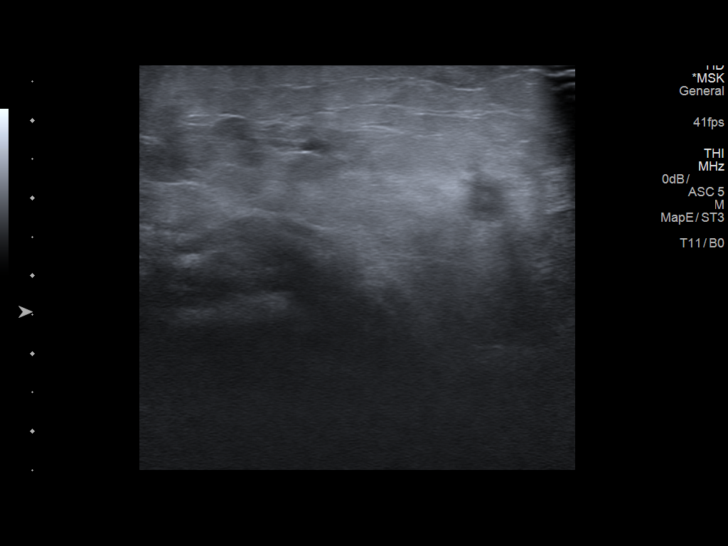
[im 7/11]
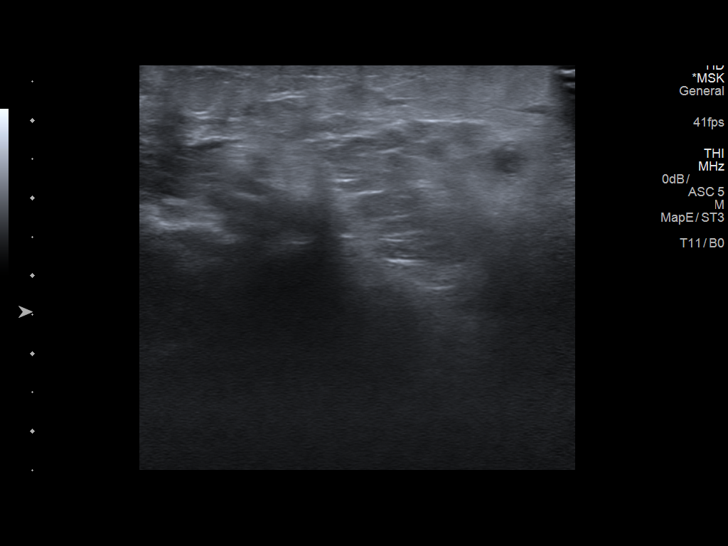
[im 8/11]
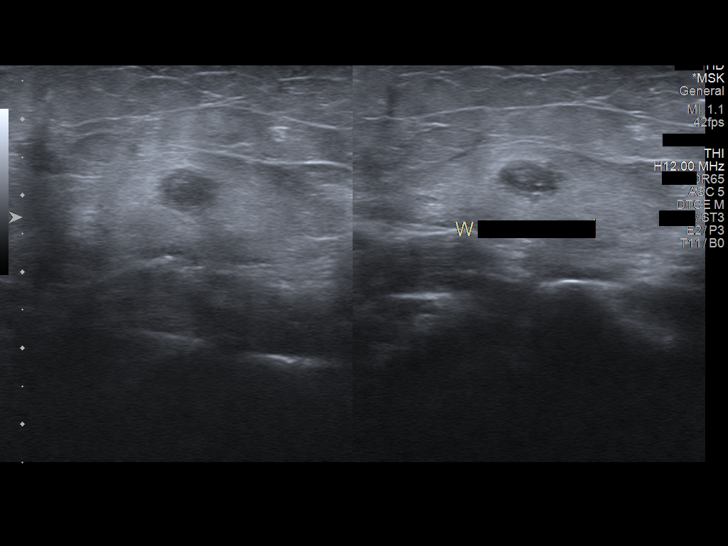
[im 9/11]
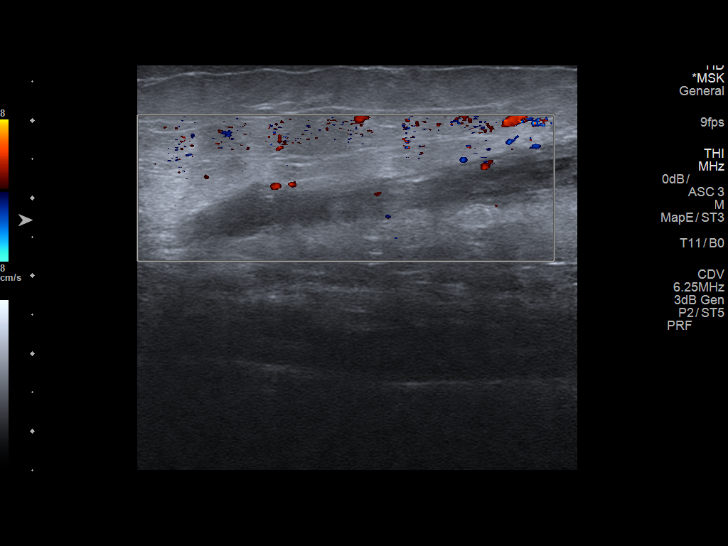
[im 10/11]
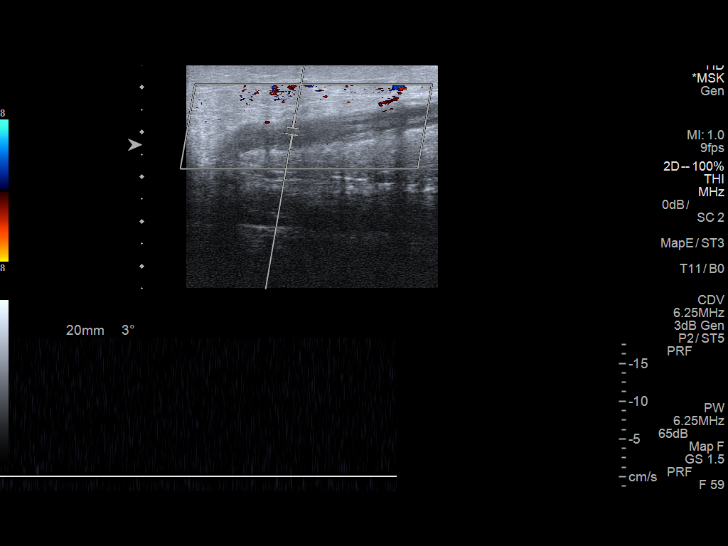
[im 11/11]
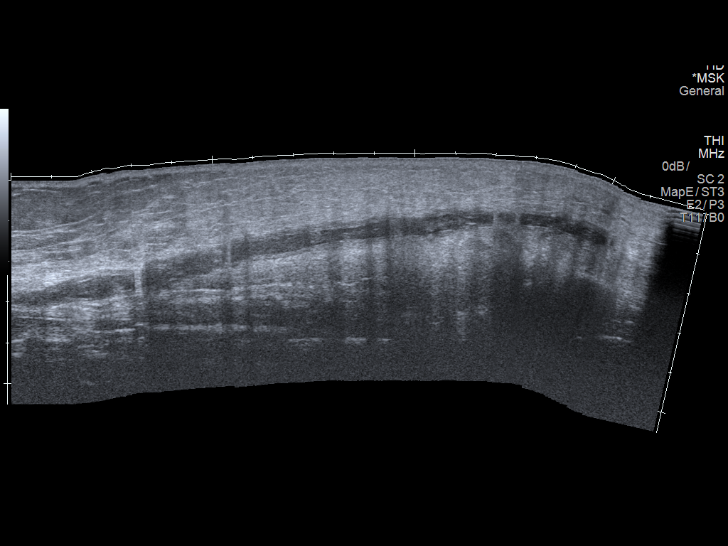

[11 of 11 positions shown; findings below may reference images not displayed]

FINDINGS: Considerable soft tissue edema is noted. Superficial
thrombophlebitis is seen as well throughout the entire visualized
segment of what appears to be the basilic vein. No sizable abscess
is seen.
IMPRESSION: Changes consistent with likely basilic vein thrombosis and
associated soft tissue edema.

## 2017-02-11 ENCOUNTER — Ambulatory Visit (HOSPITAL_COMMUNITY): Payer: BLUE CROSS/BLUE SHIELD | Admitting: Psychiatry

## 2017-02-13 ENCOUNTER — Other Ambulatory Visit: Payer: Self-pay

## 2017-02-13 ENCOUNTER — Ambulatory Visit (INDEPENDENT_AMBULATORY_CARE_PROVIDER_SITE_OTHER): Payer: BLUE CROSS/BLUE SHIELD | Admitting: Psychiatry

## 2017-02-13 ENCOUNTER — Encounter (HOSPITAL_COMMUNITY): Payer: Self-pay | Admitting: Psychiatry

## 2017-02-13 VITALS — BP 130/80 | HR 52 | Ht 65.5 in | Wt 183.0 lb

## 2017-02-13 DIAGNOSIS — G4700 Insomnia, unspecified: Secondary | ICD-10-CM

## 2017-02-13 DIAGNOSIS — M797 Fibromyalgia: Secondary | ICD-10-CM | POA: Diagnosis not present

## 2017-02-13 DIAGNOSIS — F411 Generalized anxiety disorder: Secondary | ICD-10-CM | POA: Diagnosis not present

## 2017-02-13 DIAGNOSIS — F331 Major depressive disorder, recurrent, moderate: Secondary | ICD-10-CM

## 2017-02-13 MED ORDER — ARIPIPRAZOLE 5 MG PO TABS: 5.0000 mg | ORAL_TABLET | Freq: Every day | ORAL | 0 refills | Status: DC

## 2017-02-13 MED ORDER — PAROXETINE HCL 20 MG PO TABS: 10.0000 mg | ORAL_TABLET | Freq: Every day | ORAL | 0 refills | Status: DC

## 2017-02-13 NOTE — Progress Notes (Signed)
Patient ID: Danielle Boyd, female   DOB: May 26, 1955, 62 y.o.   MRN: [id number]  Psychiatric [facility] Outpatient Visit  Patient Identification: Danielle Boyd MRN:  [id number] Date of Evaluation:  02/13/2017 Referral Source: Self and Dr. Alton Revere (Neurology) Chief Complaint:   Chief Complaint    Follow-up; Other     Visit Diagnosis:    ICD-10-CM   1. 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 Fibromyalgia M79.7   3. GAD (generalized anxiety disorder) F41.1   4. Insomnia, unspecified type G47.00    Diagnosis:   Patient Active Problem List   Diagnosis Date Noted  . Benign hypertension [I10] 04/04/2015  . Adult BMI 30+ [IMO0002] 03/27/2015  . Acid reflux [K21.9] 03/27/2015  . 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 Snores [R06.83] 03/06/2015  . Restless leg [G25.81] 03/06/2015  . Pins and needles sensation [R20.2] 03/06/2015  . Leg pain [M79.606] 03/06/2015  . Cannot sleep [G47.00] 03/06/2015  . 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 BP (high blood pressure) [I10] 03/06/2015  . HLD (hyperlipidemia) [E78.5] 03/06/2015  . Fatigue [R53.83] 03/06/2015  . Benign essential tremor [G25.0] 03/06/2015  . Clinical depression [F32.9] 03/06/2015  . 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 Leg paresthesia [R20.2] 03/06/2015  . Fibromyalgia [M79.7] 11/28/2014  . Low back pain [M54.5] 11/28/2014  . History of atypical nevus [Z87.898] 10/12/2012   History of Present Illness:  62 years old currently married Caucasian female initially referred by neurology and herself for management of depression, insomnia and anxiety.  Less foggy since ativan lowered No side effects  depression better  husband supportive  Pain effects mood, on neurontin    Severity of depression: 8/10   Past Medical History:   Past Medical History:  Diagnosis Date  . Fatigue    History reviewed. No pertinent surgical history. Family History: History reviewed. No pertinent family history. Social History:   Social History   Socioeconomic History  . Marital status: Married    Spouse name: None  . Number of children: None  . Years of education: None  . Highest education level: None  Social Needs  . Financial resource strain: None  . Food insecurity - worry: None  . Food insecurity - inability: None  . Transportation needs - medical: None  . Transportation needs - non-medical: None  Occupational History  . None  Tobacco Use  . Smoking status: Never Smoker  . Smokeless tobacco: Never Used  Substance and Sexual Activity  . Alcohol use: Yes    Alcohol/week: 0.6 oz    Types: 1 Cans of beer per week  . Drug use: No  . Sexual activity: Not Currently  Other Topics Concern  . None  Social History Narrative  . None     Psychiatric Specialty Exam: HPI  Review of Systems  Constitutional: Negative for fever.  Cardiovascular: Negative for chest pain.  Musculoskeletal: Positive for myalgias.  Skin: Negative for itching and rash.  Neurological: Negative for tingling.  Psychiatric/Behavioral: Negative for substance abuse and suicidal ideas.    Blood pressure 130/80, pulse (!) 52, height 5' 5.5" (1.664 m), weight 183 lb (83 kg).Body mass index is 29.99 kg/m.  General Appearance: Casual  Eye Contact:  Fair  Speech:  Slow  Volume:  Decreased  Mood: fair  Affect:  Reactive pleasant  Thought Process:  Coherent  Orientation:  Full (Time, Place, and Person)  Thought Content:  Rumination  Suicidal Thoughts:  No  Homicidal Thoughts:  No  Memory:  Immediate;   Fair Recent;   Fair  Judgement:  Fair  Insight:  Shallow  Psychomotor Activity:  Decreased  Concentration:  Fair  Recall:  [employer]Fair  Fund of Knowledge:Fair  Language: Fair  Akathisia:  Negative  Handed:  Right  AIMS (if indicated):    Assets:   Desire for Improvement  ADL's:  Intact  Cognition: WNL  Sleep:  poor     Allergies:   Allergies  Allergen Reactions  . Duloxetine Other (See Comments)    Abdominal pain  . Milnacipran Nausea Only  . Nsaids Nausea Only     GI upse  . Pramipexole Other (See Comments)    Insomnia  . Ropinirole Nausea Only  . Tolmetin Nausea Only        Current Medications: Current Outpatient Medications  Medication Sig Dispense Refill  . ARIPiprazole (ABILIFY) 5 MG tablet Take 1 tablet (5 mg total) by mouth daily. 90 tablet 0  . ciprofloxacin (CIPRO) 500 MG tablet     . gabapentin (NEURONTIN) 400 MG capsule TAKE ONE CAPSULE BY MOUTH IN THE MORNING, AND 3 OR 4 AT BEDTIME  6  . LORazepam (ATIVAN) 2 MG tablet TAKE 1 TO 2 TABLETS AT BEDTIME  2  . NEUPRO 2 MG/24HR APPLY 1 PATCH DAILY AS DIRECTED.  4  . PARoxetine (PAXIL) 20 MG tablet Take 0.5 tablets (10 mg total) by mouth daily. 15 tablet 0  . primidone (MYSOLINE) 50 MG tablet Take 50 mg by mouth.    . propranolol ER (INDERAL LA) 160 MG SR capsule Take 160 mg by mouth daily.  5  . simvastatin (ZOCOR) 20 MG tablet Take 20 mg by mouth daily.  3   No current facility-administered medications for this visit.       Treatment Plan Summary: Medication management and Plan as follows  1. WUJ:[id number]:improved. Continue meds paxil and abilify 2. GAD: not worse Continue gabapentin prescribed by providers.  Cautioned not to increase vicodin.  3. Insomnia: reasonable. Reviewed sleep hygien  FU 2 -3 months Azayla Polo 2/1/20198:51 AM

## 2017-05-08 ENCOUNTER — Ambulatory Visit (HOSPITAL_COMMUNITY): Payer: BLUE CROSS/BLUE SHIELD | Admitting: Psychiatry

## 2017-05-25 ENCOUNTER — Encounter (HOSPITAL_COMMUNITY): Payer: Self-pay | Admitting: Psychiatry

## 2017-05-25 ENCOUNTER — Ambulatory Visit (INDEPENDENT_AMBULATORY_CARE_PROVIDER_SITE_OTHER): Payer: BLUE CROSS/BLUE SHIELD | Admitting: Psychiatry

## 2017-05-25 VITALS — BP 124/82 | HR 60 | Ht 65.5 in | Wt 171.6 lb

## 2017-05-25 DIAGNOSIS — M797 Fibromyalgia: Secondary | ICD-10-CM

## 2017-05-25 DIAGNOSIS — F411 Generalized anxiety disorder: Secondary | ICD-10-CM

## 2017-05-25 DIAGNOSIS — G4700 Insomnia, unspecified: Secondary | ICD-10-CM | POA: Diagnosis not present

## 2017-05-25 DIAGNOSIS — F331 Major depressive disorder, recurrent, moderate: Secondary | ICD-10-CM | POA: Diagnosis not present

## 2017-05-25 DIAGNOSIS — M549 Dorsalgia, unspecified: Secondary | ICD-10-CM

## 2017-05-25 MED ORDER — ARIPIPRAZOLE 5 MG PO TABS: 5.0000 mg | ORAL_TABLET | Freq: Every day | ORAL | 0 refills | Status: DC

## 2017-05-25 MED ORDER — PAROXETINE HCL 20 MG PO TABS: 10.0000 mg | ORAL_TABLET | Freq: Every day | ORAL | 1 refills | Status: DC

## 2017-05-25 NOTE — Progress Notes (Signed)
Patient ID: Danielle Boyd, female   DOB: July 04, 1955, 62 y.o.   MRN: [id number]  Psychiatric [facility] Outpatient Visit  Patient Identification: Danielle Boyd MRN:  [id number] Date of Evaluation:  05/25/2017 Referral Source: Self and Dr. Alton Revere (Neurology) Chief Complaint:    Visit Diagnosis:    ICD-10-CM   1. 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 Fibromyalgia M79.7   3. GAD (generalized anxiety disorder) F41.1   4. Insomnia, unspecified type G47.00    Diagnosis:   Patient Active Problem List   Diagnosis Date Noted  . Benign hypertension [I10] 04/04/2015  . Adult BMI 30+ [IMO0002] 03/27/2015  . Acid reflux [K21.9] 03/27/2015  . 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 Snores [R06.83] 03/06/2015  . Restless leg [G25.81] 03/06/2015  . Pins and needles sensation [R20.2] 03/06/2015  . Leg pain [M79.606] 03/06/2015  . Cannot sleep [G47.00] 03/06/2015  . 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 BP (high blood pressure) [I10] 03/06/2015  . HLD (hyperlipidemia) [E78.5] 03/06/2015  . Fatigue [R53.83] 03/06/2015  . Benign essential tremor [G25.0] 03/06/2015  . Clinical depression [F32.9] 03/06/2015  . 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 Leg paresthesia [R20.2] 03/06/2015  . Fibromyalgia [M79.7] 11/28/2014  . Low back pain [M54.5] 11/28/2014  . History of atypical nevus [Z87.898] 10/12/2012   History of Present Illness:  62 years old currently married Caucasian female initially referred by neurology and herself for management of depression, insomnia and anxiety.  Had back surgery , still back pain and tiredness Mood upset at times  not as foggy without ativan. Taking now low dose    husband supportive  Depression fair    Severity of depression: 8/10   Past Medical History:   Past Medical History:  Diagnosis Date  . Fatigue    History reviewed. No pertinent surgical history. Family History: History reviewed. No pertinent family history. Social History:   Social History   Socioeconomic History  . Marital status: Married    Spouse name: Not on file  . Number of children: Not on file  . Years of education: Not on file  . Highest education level: Not on file  Occupational History  . Not on file  Social Needs  . Financial resource strain: Not on file  . Food insecurity:    Worry: Not on file    Inability: Not on file  . Transportation needs:    Medical: Not on file    Non-medical: Not on file  Tobacco Use  . Smoking status: Never Smoker  . Smokeless tobacco: Never Used  Substance and Sexual Activity  . Alcohol use: Yes    Alcohol/week: 0.6 oz    Types: 1 Cans of beer per week  . Drug use: No  . Sexual activity: Not Currently  Lifestyle  . Physical activity:    Days per week: Not on file    Minutes per session: Not on file  . Stress: Not on file  Relationships  . Social connections:    Talks on phone: Not on file    Gets together: Not on file    Attends religious service: Not on file    Active member of club or organization: Not on file    Attends meetings of clubs or organizations: Not on file    Relationship status: Not on file  Other Topics Concern  .  Not on file  Social History Narrative  . Not on file     Psychiatric Specialty Exam: HPI  Review of Systems  Constitutional: Negative for fever.  Cardiovascular: Negative for chest pain.  Musculoskeletal: Positive for back pain and myalgias.  Skin: Negative for itching and rash.  Neurological: Negative for tingling.  Psychiatric/Behavioral: Negative for substance abuse and suicidal ideas.    Blood pressure 124/82, pulse 60, height 5' 5.5" (1.664 m), weight 171 lb 9.6 oz (77.8 kg), SpO2 95 %.Body mass index is 28.12 kg/m.  General Appearance: Casual  Eye Contact:  Fair   Speech:  Slow  Volume:  Decreased  Mood: fair  Affect:  Reactive pleasant  Thought Process:  Coherent  Orientation:  Full (Time, Place, and Person)  Thought Content:  Rumination  Suicidal Thoughts:  No  Homicidal Thoughts:  No  Memory:  Immediate;   Fair Recent;   Fair  Judgement:  Fair  Insight:  Shallow  Psychomotor Activity:  Decreased  Concentration:  Fair  Recall:  [employer] of Knowledge:Fair  Language: Fair  Akathisia:  Negative  Handed:  Right  AIMS (if indicated):    Assets:  Desire for Improvement  ADL's:  Intact  Cognition: WNL  Sleep:  poor     Allergies:   Allergies  Allergen Reactions  . Duloxetine Other (See Comments)    Abdominal pain  . Milnacipran Nausea Only  . Nsaids Nausea Only     GI upse  . Pramipexole Other (See Comments)    Insomnia  . Ropinirole Nausea Only  . Tolmetin Nausea Only        Current Medications: Current Outpatient Medications  Medication Sig Dispense Refill  . ARIPiprazole (ABILIFY) 5 MG tablet Take 1 tablet (5 mg total) by mouth daily. 90 tablet 0  . ciprofloxacin (CIPRO) 500 MG tablet     . gabapentin (NEURONTIN) 400 MG capsule TAKE ONE CAPSULE BY MOUTH IN THE MORNING, AND 3 OR 4 AT BEDTIME  6  . LORazepam (ATIVAN) 2 MG tablet TAKE 1 TO 2 TABLETS AT BEDTIME  2  . NEUPRO 2 MG/24HR APPLY 1 PATCH DAILY AS DIRECTED.  4  . PARoxetine (PAXIL) 20 MG tablet Take 0.5 tablets (10 mg total) by mouth daily. 15 tablet 1  . primidone (MYSOLINE) 50 MG tablet Take 50 mg by mouth.    . propranolol ER (INDERAL LA) 160 MG SR capsule Take 160 mg by mouth daily.  5  . simvastatin (ZOCOR) 20 MG tablet Take 20 mg by mouth daily.  3   No current facility-administered medications for this visit.       Treatment Plan Summary: Medication management and Plan as follows  1. ZOX:WRUEA  . Pain effects mood. Continue paxil 2. GAD: not worse Back condition effects pain. .  3. Insomnia: reasonable. Reviewed sleep hygien  FU 2 -3  months Danielle Boyd 5/13/20198:59 AM

## 2017-07-19 ENCOUNTER — Other Ambulatory Visit (HOSPITAL_COMMUNITY): Payer: Self-pay | Admitting: Psychiatry

## 2017-08-25 ENCOUNTER — Ambulatory Visit (INDEPENDENT_AMBULATORY_CARE_PROVIDER_SITE_OTHER): Payer: BLUE CROSS/BLUE SHIELD | Admitting: Psychiatry

## 2017-08-25 ENCOUNTER — Encounter (HOSPITAL_COMMUNITY): Payer: Self-pay | Admitting: Psychiatry

## 2017-08-25 ENCOUNTER — Other Ambulatory Visit: Payer: Self-pay

## 2017-08-25 VITALS — BP 132/88 | HR 66 | Ht 65.5 in | Wt 168.0 lb

## 2017-08-25 DIAGNOSIS — F331 Major depressive disorder, recurrent, moderate: Secondary | ICD-10-CM

## 2017-08-25 DIAGNOSIS — F411 Generalized anxiety disorder: Secondary | ICD-10-CM

## 2017-08-25 DIAGNOSIS — G4700 Insomnia, unspecified: Secondary | ICD-10-CM | POA: Diagnosis not present

## 2017-08-25 DIAGNOSIS — M797 Fibromyalgia: Secondary | ICD-10-CM | POA: Diagnosis not present

## 2017-08-25 MED ORDER — PAROXETINE HCL 20 MG PO TABS: 10.0000 mg | ORAL_TABLET | Freq: Every day | ORAL | 0 refills | Status: DC

## 2017-08-25 NOTE — Progress Notes (Signed)
Patient ID: Charlott Hollererri Crispo, female   DOB: Dec 12, 1955, 62 y.o.   MRN: [id number]  [facility]  Patient Identification: Charlott Hollererri Noyes MRN:  [id number] Date of Evaluation:  08/25/2017 Referral Source: Self and Dr. Alton RevereFeraru (Neurology) Chief Complaint:   Chief Complaint    Follow-up; Other     Visit Diagnosis:    ICD-10-CM   1. 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 Fibromyalgia M79.7   3. GAD (generalized anxiety disorder) F41.1   4. Insomnia, unspecified type G47.00    Diagnosis:   Patient Active Problem List   Diagnosis Date Noted  . Benign hypertension [I10] 04/04/2015  . Adult BMI 30+ [IMO0002] 03/27/2015  . Acid reflux [K21.9] 03/27/2015  . 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 Snores [R06.83] 03/06/2015  . Restless leg [G25.81] 03/06/2015  . Pins and needles sensation [R20.2] 03/06/2015  . Leg pain [M79.606] 03/06/2015  . Cannot sleep [G47.00] 03/06/2015  . 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 BP (high blood pressure) [I10] 03/06/2015  . HLD (hyperlipidemia) [E78.5] 03/06/2015  . Fatigue [R53.83] 03/06/2015  . Benign essential tremor [G25.0] 03/06/2015  . Clinical depression [F32.9] 03/06/2015  . 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 Leg paresthesia [R20.2] 03/06/2015  . Fibromyalgia [M79.7] 11/28/2014  . Low back pain [M54.5] 11/28/2014  . History of atypical nevus [Z87.898] 10/12/2012   History of Present Illness:  62 years old currently married Caucasian female initially referred by neurology and herself for management of depression, insomnia and anxiety.  Had back surgery , still back pain and tiredness Doing fair regarding depression but having tremors which upsets her. Has neurology appointment but next year. Has been on inderal still  taking  On ativan low dose. Not foggy feeling   husband supportive  Depression fair  Severity of depression: subdued due to tremors     Past Medical History:  Past Medical History:  Diagnosis Date  . Fatigue    History reviewed. No pertinent surgical history. Family History: History reviewed. No pertinent family history. Social History:   Social History   Socioeconomic History  . Marital status: Married    Spouse name: Not on file  . Number of children: Not on file  . Years of education: Not on file  . Highest education level: Not on file  Occupational History  . Not on file  Social Needs  . Financial resource strain: Not on file  . Food insecurity:    Worry: Not on file    Inability: Not on file  . Transportation needs:    Medical: Not on file    Non-medical: Not on file  Tobacco Use  . Smoking status: Never Smoker  . Smokeless tobacco: Never Used  Substance and Sexual Activity  . Alcohol use: Yes    Alcohol/week: 1.0 standard drinks    Types: 1 Cans of beer per week  . Drug use: No  . Sexual activity: Not Currently  Lifestyle  . Physical activity:    Days per week: Not on file    Minutes per session: Not on file  . Stress: Not on file  Relationships  . Social connections:    Talks on phone: Not on file    Gets together: Not on file    Attends religious service: Not on file    Active member of club  or organization: Not on file    Attends meetings of clubs or organizations: Not on file    Relationship status: Not on file  Other Topics Concern  . Not on file  Social History Narrative  . Not on file     Psychiatric Specialty Exam: HPI  Review of Systems  Constitutional: Negative for fever.  Cardiovascular: Negative for chest pain.  Musculoskeletal: Positive for myalgias.  Skin: Negative for itching and rash.  Neurological: Positive for tremors. Negative for tingling.  Psychiatric/Behavioral: Negative for substance abuse and suicidal ideas.     Blood pressure 132/88, pulse 66, height 5' 5.5" (1.664 m), weight 168 lb (76.2 kg).Body mass index is 27.53 kg/m.  General Appearance: Casual  Eye Contact:  Fair  Speech:  Slow  Volume:  Decreased  Mood: fair  Affect:  congruent  Thought Process:  Coherent  Orientation:  Full (Time, Place, and Person)  Thought Content:  Rumination  Suicidal Thoughts:  No  Homicidal Thoughts:  No  Memory:  Immediate;   Fair Recent;   Fair  Judgement:  Fair  Insight:  Shallow  Psychomotor Activity:  Decreased  Concentration:  Fair  Recall:  [employer]Fair  Fund of Knowledge:Fair  Language: Fair  Akathisia:  Negative  Handed:  Right  AIMS (if indicated):    Assets:  Desire for Improvement  ADL's:  Intact  Cognition: WNL  Sleep:  poor     Allergies:   Allergies  Allergen Reactions  . Amantadine     Unsteady gait  . Duloxetine Other (See Comments)    Abdominal pain  . Milnacipran Nausea Only  . Nsaids Nausea Only     GI upse  . Pramipexole Other (See Comments)    Insomnia  . Primidone     Too sleepy  . Ropinirole Nausea Only  . Tolmetin Nausea Only        Current Medications: Current Outpatient Medications  Medication Sig Dispense Refill  . ciprofloxacin (CIPRO) 500 MG tablet     . gabapentin (NEURONTIN) 400 MG capsule TAKE ONE CAPSULE BY MOUTH IN THE MORNING, AND 3 OR 4 AT BEDTIME  6  . NEUPRO 2 MG/24HR APPLY 1 PATCH DAILY AS DIRECTED.  4  . PARoxetine (PAXIL) 20 MG tablet Take 0.5 tablets (10 mg total) by mouth daily. 30 tablet 0  . primidone (MYSOLINE) 50 MG tablet Take 50 mg by mouth.    . propranolol ER (INDERAL LA) 160 MG SR capsule Take 160 mg by mouth daily.  5  . simvastatin (ZOCOR) 20 MG tablet Take 20 mg by mouth daily.  3   No current facility-administered medications for this visit.       Treatment Plan Summary: Medication management and Plan as follows  1. MVH:QIONGEXDD:concern is tremor. Has neurology appointment. Will dc abilify its small dose and says had tremors before  being on it. Will still stop for now  Increase paxil to 20mg  2. GAD: fluctuates. Increase paxil to 20mg  Back condition effects pain. .  3. Insomnia: reasonable. Reviewed sleep hygiene Concerns addressed. Fu 2629m.   Thresa RossNadeem Tesean Stump 8/13/20199:03 AM

## 2017-09-29 ENCOUNTER — Encounter (HOSPITAL_COMMUNITY): Payer: Self-pay | Admitting: Psychiatry

## 2017-09-29 ENCOUNTER — Ambulatory Visit (INDEPENDENT_AMBULATORY_CARE_PROVIDER_SITE_OTHER): Payer: BLUE CROSS/BLUE SHIELD | Admitting: Psychiatry

## 2017-09-29 DIAGNOSIS — M797 Fibromyalgia: Secondary | ICD-10-CM

## 2017-09-29 DIAGNOSIS — F331 Major depressive disorder, recurrent, moderate: Secondary | ICD-10-CM

## 2017-09-29 DIAGNOSIS — G4700 Insomnia, unspecified: Secondary | ICD-10-CM | POA: Diagnosis not present

## 2017-09-29 DIAGNOSIS — F411 Generalized anxiety disorder: Secondary | ICD-10-CM

## 2017-09-29 MED ORDER — PAROXETINE HCL 20 MG PO TABS: 10.0000 mg | ORAL_TABLET | Freq: Every day | ORAL | 2 refills | Status: DC

## 2017-09-29 NOTE — Progress Notes (Signed)
Patient ID: Danielle Boyd, female   DOB: 04/27/1955, 62 y.o.   MRN: [id number]  [facility]  Patient Identification: Danielle Boyd MRN:  [id number] Date of Evaluation:  09/29/2017 Referral Source: Self and Dr. Alton RevereFeraru (Neurology) Chief Complaint:   Chief Complaint    Follow-up     Visit Diagnosis:    ICD-10-CM   1. 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 Fibromyalgia M79.7   3. GAD (generalized anxiety disorder) F41.1   4. Insomnia, unspecified type G47.00    Diagnosis:   Patient Active Problem List   Diagnosis Date Noted  . Benign hypertension [I10] 04/04/2015  . Adult BMI 30+ [IMO0002] 03/27/2015  . Acid reflux [K21.9] 03/27/2015  . 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 Snores [R06.83] 03/06/2015  . Restless leg [G25.81] 03/06/2015  . Pins and needles sensation [R20.2] 03/06/2015  . Leg pain [M79.606] 03/06/2015  . Cannot sleep [G47.00] 03/06/2015  . 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 BP (high blood pressure) [I10] 03/06/2015  . HLD (hyperlipidemia) [E78.5] 03/06/2015  . Fatigue [R53.83] 03/06/2015  . Benign essential tremor [G25.0] 03/06/2015  . Clinical depression [F32.9] 03/06/2015  . 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 Leg paresthesia [R20.2] 03/06/2015  . Fibromyalgia [M79.7] 11/28/2014  . Low back pain [M54.5] 11/28/2014  . History of atypical nevus [Z87.898] 10/12/2012   History of Present Illness:  62 years old currently married Caucasian female initially referred by neurology and herself for management of depression, insomnia and anxiety.  Has been having tremors Abilify was discontinued last visit tremors are the same.  Is also on Inderal neurology appointment this next year.  Will increase Paxil that has helped her depression  and anxiety overall she takes Ativan low-dose from primary care physician for anxiety      husband : supportive Depression fair  Severity of depression: subdued not hopeless  modifying Factor: grand daughter   Past Medical History:  Past Medical History:  Diagnosis Date  . Fatigue    History reviewed. No pertinent surgical history. Family History: History reviewed. No pertinent family history. Social History:   Social History   Socioeconomic History  . Marital status: Married    Spouse name: Not on file  . Number of children: Not on file  . Years of education: Not on file  . Highest education level: Not on file  Occupational History  . Not on file  Social Needs  . Financial resource strain: Not on file  . Food insecurity:    Worry: Not on file    Inability: Not on file  . Transportation needs:    Medical: Not on file    Non-medical: Not on file  Tobacco Use  . Smoking status: Never Smoker  . Smokeless tobacco: Never Used  Substance and Sexual Activity  . Alcohol use: Yes    Alcohol/week: 1.0 standard drinks    Types: 1 Cans of beer per week  . Drug use: No  . Sexual activity: Not Currently  Lifestyle  . Physical activity:    Days per week: Not on file    Minutes per session: Not on file  . Stress: Not on file  Relationships  . Social connections:    Talks on phone: Not on file    Gets together: Not on file    Attends religious service:  Not on file    Active member of club or organization: Not on file    Attends meetings of clubs or organizations: Not on file    Relationship status: Not on file  Other Topics Concern  . Not on file  Social History Narrative  . Not on file     Psychiatric Specialty Exam: HPI  Review of Systems  Constitutional: Negative for fever.  Cardiovascular: Negative for palpitations.  Musculoskeletal: Positive for myalgias.  Skin: Negative for itching and rash.  Neurological: Positive for tremors. Negative for tingling.   Psychiatric/Behavioral: Negative for substance abuse and suicidal ideas.    There were no vitals taken for this visit.There is no height or weight on file to calculate BMI.  General Appearance: Casual  Eye Contact:  Fair  Speech:  Slow  Volume:  Decreased  Mood: fair  Affect:  congruent  Thought Process:  Coherent  Orientation:  Full (Time, Place, and Person)  Thought Content:  Rumination  Suicidal Thoughts:  No  Homicidal Thoughts:  No  Memory:  Immediate;   Fair Recent;   Fair  Judgement:  Fair  Insight:  Shallow  Psychomotor Activity:  Decreased  Concentration:  Fair  Recall:  [employer] of Knowledge:Fair  Language: Fair  Akathisia:  Negative  Handed:  Right  AIMS (if indicated):    Assets:  Desire for Improvement  ADL's:  Intact  Cognition: WNL  Sleep:  poor     Allergies:   Allergies  Allergen Reactions  . Amantadine     Unsteady gait  . Duloxetine Other (See Comments)    Abdominal pain  . Milnacipran Nausea Only  . Nsaids Nausea Only     GI upse  . Pramipexole Other (See Comments)    Insomnia  . Primidone     Too sleepy  . Ropinirole Nausea Only  . Tolmetin Nausea Only        Current Medications: Current Outpatient Medications  Medication Sig Dispense Refill  . ciprofloxacin (CIPRO) 500 MG tablet     . gabapentin (NEURONTIN) 400 MG capsule TAKE ONE CAPSULE BY MOUTH IN THE MORNING, AND 3 OR 4 AT BEDTIME  6  . NEUPRO 2 MG/24HR APPLY 1 PATCH DAILY AS DIRECTED.  4  . PARoxetine (PAXIL) 20 MG tablet Take 0.5 tablets (10 mg total) by mouth daily. 30 tablet 2  . primidone (MYSOLINE) 50 MG tablet Take 50 mg by mouth.    . propranolol ER (INDERAL LA) 160 MG SR capsule Take 160 mg by mouth daily.  5  . simvastatin (ZOCOR) 20 MG tablet Take 20 mg by mouth daily.  3   No current facility-administered medications for this visit.       Treatment Plan Summary: Medication management and Plan as follows  1. MDD: not worse. Concern is tremor. Not worse .  Continue paxil fu with neurology    2. GAD: not worse. Continue paxil Back condition effects pain. .  3. Insomnia: reasonable. Reviewed sleep hygiene Concerns addressed. Wants to fu after neurology.  paxil renewed   Thresa Ross 9/17/201910:15 AM

## 2017-10-08 ENCOUNTER — Other Ambulatory Visit (HOSPITAL_COMMUNITY): Payer: Self-pay | Admitting: Psychiatry

## 2018-01-26 ENCOUNTER — Encounter (HOSPITAL_COMMUNITY): Payer: Self-pay | Admitting: Psychiatry

## 2018-01-26 ENCOUNTER — Ambulatory Visit (INDEPENDENT_AMBULATORY_CARE_PROVIDER_SITE_OTHER): Payer: BLUE CROSS/BLUE SHIELD | Admitting: Psychiatry

## 2018-01-26 VITALS — BP 120/78 | HR 60 | Ht 65.5 in | Wt 173.0 lb

## 2018-01-26 DIAGNOSIS — M797 Fibromyalgia: Secondary | ICD-10-CM

## 2018-01-26 DIAGNOSIS — G4700 Insomnia, unspecified: Secondary | ICD-10-CM | POA: Diagnosis not present

## 2018-01-26 DIAGNOSIS — F411 Generalized anxiety disorder: Secondary | ICD-10-CM

## 2018-01-26 DIAGNOSIS — F331 Major depressive disorder, recurrent, moderate: Secondary | ICD-10-CM | POA: Diagnosis not present

## 2018-01-26 MED ORDER — PAROXETINE HCL 30 MG PO TABS: 30.0000 mg | ORAL_TABLET | Freq: Every day | ORAL | 1 refills | Status: DC

## 2018-01-26 NOTE — Progress Notes (Signed)
Patient ID: Danielle Boyd, female   DOB: 09-11-1955, 63 y.o.   MRN: [id number]  [facility] Outpatient Visit  Patient Identification: Danielle Boyd MRN:  [id number] Date of Evaluation:  01/26/2018 Referral Source: Self and Dr. Alton Revere (Neurology) Chief Complaint:   Chief Complaint    Follow-up; Depression     Visit Diagnosis:    ICD-10-CM   1. 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 GAD (generalized anxiety disorder) F41.1   3. Insomnia, unspecified type G47.00   4. Fibromyalgia M79.7    Diagnosis:   Patient Active Problem List   Diagnosis Date Noted  . Benign hypertension [I10] 04/04/2015  . Adult BMI 30+ [IMO0002] 03/27/2015  . Acid reflux [K21.9] 03/27/2015  . 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 Snores [R06.83] 03/06/2015  . Restless leg [G25.81] 03/06/2015  . Pins and needles sensation [R20.2] 03/06/2015  . Leg pain [M79.606] 03/06/2015  . Cannot sleep [G47.00] 03/06/2015  . 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 BP (high blood pressure) [I10] 03/06/2015  . HLD (hyperlipidemia) [E78.5] 03/06/2015  . Fatigue [R53.83] 03/06/2015  . Benign essential tremor [G25.0] 03/06/2015  . Clinical depression [F32.9] 03/06/2015  . 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 Leg paresthesia [R20.2] 03/06/2015  . Fibromyalgia [M79.7] 11/28/2014  . Low back pain [M54.5] 11/28/2014  . History of atypical nevus [Z87.898] 10/12/2012   History of Present Illness:  63 years old currently married Caucasian female initially referred by neurology and herself for management of depression, insomnia and anxiety.  Still has tremors. Has not seen neurology yet, appointment [facility] at [facility] 25 Last visit we have been giving her Paxil that has helped some of the dysphoria and depression  but overall during the Christmas time she has been feeling down subdued.again.   She also is on Inderal, gabapentin she also takes Ativan at night for sleep  Patient she feels fine but the tremors are still there at the outstretched arm  She is not on any atypical antipsychotic or mood stabilizer as of now    husband : supportive Depression subdued Severity of depression: subdued not hopeless  modifying Factor: grand daughter   Past Medical History:  Past Medical History:  Diagnosis Date  . Fatigue    History reviewed. No pertinent surgical history. Family History: History reviewed. No pertinent family history. Social History:   Social History   Socioeconomic History  . Marital status: Married    Spouse name: Not on file  . Number of children: Not on file  . Years of education: Not on file  . Highest education level: Not on file  Occupational History  . Not on file  Social Needs  . Financial resource strain: Not on file  . Food insecurity:    Worry: Not on file    Inability: Not on file  . Transportation needs:    Medical: Not on file    Non-medical: Not on file  Tobacco Use  . Smoking status: Never Smoker  . Smokeless tobacco: Never Used  Substance and Sexual Activity  . Alcohol use: Yes    Alcohol/week: 1.0 standard drinks    Types: 1 Cans of beer per week  . Drug use: No  . Sexual activity: Not Currently  Lifestyle  . Physical activity:    Days per week: Not on file  Minutes per session: Not on file  . Stress: Not on file  Relationships  . Social connections:    Talks on phone: Not on file    Gets together: Not on file    Attends religious service: Not on file    Active member of club or organization: Not on file    Attends meetings of clubs or organizations: Not on file    Relationship status: Not on file  Other Topics Concern  . Not on file  Social History Narrative  . Not on file     Psychiatric Specialty Exam: Depression          Associated symptoms include myalgias.  Associated symptoms include no suicidal ideas.   Review of Systems  Constitutional: Negative for fever.  Cardiovascular: Negative for palpitations.  Musculoskeletal: Positive for myalgias.  Skin: Negative for itching and rash.  Neurological: Positive for tremors. Negative for tingling.  Psychiatric/Behavioral: Positive for depression. Negative for substance abuse and suicidal ideas.    Blood pressure 120/78, pulse 60, height 5' 5.5" (1.664 m), weight 173 lb (78.5 kg), SpO2 94 %.Body mass index is 28.35 kg/m.  General Appearance: Casual  Eye Contact:  Fair  Speech:  Slow  Volume:  Decreased  Mood: subdued  Affect:  congruent  Thought Process:  Coherent  Orientation:  Full (Time, Place, and Person)  Thought Content:  Rumination  Suicidal Thoughts:  No  Homicidal Thoughts:  No  Memory:  Immediate;   Fair Recent;   Fair  Judgement:  Fair  Insight:  Shallow  Psychomotor Activity:  Decreased  Concentration:  Fair  Recall:  [employer]  Fund of Knowledge:Fair  Language: Fair  Akathisia:  Negative  Handed:  Right  AIMS (if indicated):    Assets:  Desire for Improvement  ADL's:  Intact  Cognition: WNL  Sleep:  poor     Allergies:   Allergies  Allergen Reactions  . Amantadine     Unsteady gait  . Duloxetine Other (See Comments)    Abdominal pain  . Milnacipran Nausea Only  . Nsaids Nausea Only     GI upse  . Pramipexole Other (See Comments)    Insomnia  . Primidone     Too sleepy  . Ropinirole Nausea Only  . Tolmetin Nausea Only        Current Medications: Current Outpatient Medications  Medication Sig Dispense Refill  . ciprofloxacin (CIPRO) 500 MG tablet     . gabapentin (NEURONTIN) 400 MG capsule TAKE ONE CAPSULE BY MOUTH IN THE MORNING, AND 3 OR 4 AT BEDTIME  6  . NEUPRO 2 MG/24HR APPLY 1 PATCH DAILY AS DIRECTED.  4  . PARoxetine (PAXIL) 30 MG tablet Take 1 tablet (30 mg total) by mouth daily. 30 tablet 1  . primidone  (MYSOLINE) 50 MG tablet Take 50 mg by mouth.    . propranolol ER (INDERAL LA) 160 MG SR capsule Take 160 mg by mouth daily.  5  . simvastatin (ZOCOR) 20 MG tablet Take 20 mg by mouth daily.  3   No current facility-administered medications for this visit.       Treatment Plan Summary: Medication management and Plan as follows  1. MDD: some worse since holkidays will , increase paxil to 30mg  Will see neurolgy  Next month. Says tremors upsets her  2. GAD: not worse. Continue paxil Back condition  And tremors are effecting mood..  3. Insomnia: takes ativan for sleep Concerns addressed. Wants to fu after neurology. Has  appointment next month.  Fu in 6w or earlier if symptoms worsen She is on gabapentin, inderal to discuss with neurology regarding meds and tremors Questions addressed     Thresa RossNadeem Rakhi Romagnoli 1/14/202010:02 AM

## 2018-02-19 ENCOUNTER — Other Ambulatory Visit (HOSPITAL_COMMUNITY): Payer: Self-pay | Admitting: Psychiatry

## 2018-03-10 ENCOUNTER — Telehealth (HOSPITAL_COMMUNITY): Payer: Self-pay

## 2018-03-10 ENCOUNTER — Other Ambulatory Visit: Payer: Self-pay

## 2018-03-10 ENCOUNTER — Encounter (HOSPITAL_COMMUNITY): Payer: Self-pay | Admitting: Psychiatry

## 2018-03-10 ENCOUNTER — Ambulatory Visit (INDEPENDENT_AMBULATORY_CARE_PROVIDER_SITE_OTHER): Payer: BLUE CROSS/BLUE SHIELD | Admitting: Psychiatry

## 2018-03-10 VITALS — BP 122/80 | HR 62 | Ht 65.0 in | Wt 177.0 lb

## 2018-03-10 DIAGNOSIS — F5102 Adjustment insomnia: Secondary | ICD-10-CM

## 2018-03-10 DIAGNOSIS — G4700 Insomnia, unspecified: Secondary | ICD-10-CM | POA: Diagnosis not present

## 2018-03-10 DIAGNOSIS — F411 Generalized anxiety disorder: Secondary | ICD-10-CM | POA: Diagnosis not present

## 2018-03-10 DIAGNOSIS — F331 Major depressive disorder, recurrent, moderate: Secondary | ICD-10-CM

## 2018-03-10 DIAGNOSIS — M797 Fibromyalgia: Secondary | ICD-10-CM | POA: Diagnosis not present

## 2018-03-10 MED ORDER — LEVOMILNACIPRAN HCL ER 20 MG PO CP24: 20.0000 mg | ORAL_CAPSULE | Freq: Every day | ORAL | 0 refills | Status: DC

## 2018-03-10 NOTE — Telephone Encounter (Signed)
Prior Authorization done and approved for F[employer]. Done on Covermymeds. Expires 01-12-2098

## 2018-03-10 NOTE — Progress Notes (Signed)
Patient ID: Danielle Boyd, female   DOB: 23-Jul-1955, 63 y.o.   MRN: [id number]  [facility]  Patient Identification: Danielle Boyd MRN:  [id number] Date of Evaluation:  03/10/2018 Referral Source: Self and Dr. Alton RevereFeraru (Neurology) Chief Complaint:   Chief Complaint    Follow-up; Other     Visit Diagnosis:    ICD-10-CM   1. 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 GAD (generalized anxiety disorder) F41.1   3. Insomnia, unspecified type G47.00   4. Fibromyalgia M79.7   5. Adjustment insomnia F51.02    Diagnosis:   Patient Active Problem List   Diagnosis Date Noted  . Benign hypertension [I10] 04/04/2015  . Adult BMI 30+ [IMO0002] 03/27/2015  . Acid reflux [K21.9] 03/27/2015  . 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 Snores [R06.83] 03/06/2015  . Restless leg [G25.81] 03/06/2015  . Pins and needles sensation [R20.2] 03/06/2015  . Leg pain [M79.606] 03/06/2015  . Cannot sleep [G47.00] 03/06/2015  . 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 BP (high blood pressure) [I10] 03/06/2015  . HLD (hyperlipidemia) [E78.5] 03/06/2015  . Fatigue [R53.83] 03/06/2015  . Benign essential tremor [G25.0] 03/06/2015  . Clinical depression [F32.9] 03/06/2015  . 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 Leg paresthesia [R20.2] 03/06/2015  . Fibromyalgia [M79.7] 11/28/2014  . Low back pain [M54.5] 11/28/2014  . History of atypical nevus [Z87.898] 10/12/2012   History of Present Illness:  63  years old currently married Caucasian female initially referred by neurology and herself for management of depression, insomnia and anxiety.  [facility] next visit is clinically in 3 months possible evaluation for surgery for the tremors.  Patient  wants to be on medication to avoid surgery Still feeling down but apparently she feels the tremors are causing her depression she will also be evaluated for parkinsonism next 3 months she is not on any antipsychotic  Paxil was increased still feels somewhat down but again depression is more relevant to her physical condition. Subdued.  Difficult getting things done feels withdrawn at times She also is on Inderal, gabapentin she also takes Ativan at night for sleep  Patient she feels fine but the tremors are still there at the outstretched arm   husband : supportive Depression subdued Severity of depression: subdued not hopeless  modifying Factor: grand daughter   Past Medical History:  Past Medical History:  Diagnosis Date  . Fatigue    History reviewed. No pertinent surgical history. Family History: History reviewed. No pertinent family history. Social History:   Social History   Socioeconomic History  . Marital status: Married    Spouse name: Not on file  . Number of children: Not on file  . Years of education: Not on file  . Highest education level: Not on file  Occupational History  . Not on file  Social Needs  . Financial resource strain: Not on file  . Food insecurity:    Worry: Not on file    Inability: Not on file  . Transportation needs:    Medical: Not on file    Non-medical: Not on file  Tobacco Use  . Smoking status: Never Smoker  . Smokeless tobacco: Never Used  Substance and Sexual Activity  . Alcohol use: Yes    Alcohol/week: 1.0 standard drinks  Types: 1 Cans of beer per week  . Drug use: No  . Sexual activity: Not Currently  Lifestyle  . Physical activity:    Days per week: Not on file    Minutes per session: Not on file  . Stress: Not on file  Relationships  . Social connections:    Talks on phone: Not on file    Gets together: Not on file    Attends religious service: Not on file    Active member of club or organization: Not on file     Attends meetings of clubs or organizations: Not on file    Relationship status: Not on file  Other Topics Concern  . Not on file  Social History Narrative  . Not on file     Psychiatric Specialty Exam: Depression         Associated symptoms include myalgias.  Associated symptoms include no suicidal ideas.   Review of Systems  Constitutional: Negative for fever.  Cardiovascular: Negative for palpitations.  Musculoskeletal: Positive for myalgias.  Skin: Negative for itching and rash.  Neurological: Positive for tremors. Negative for tingling.  Psychiatric/Behavioral: Positive for depression. Negative for substance abuse and suicidal ideas.    Blood pressure 122/80, pulse 62, height 5\' 5"  (1.651 m), weight 177 lb (80.3 kg).Body mass index is 29.45 kg/m.  General Appearance: Casual  Eye Contact:  Fair  Speech:  Slow  Volume:  Decreased  Mood: subdued  Affect:  congruent  Thought Process:  Coherent  Orientation:  Full (Time, Place, and Person)  Thought Content:  Rumination  Suicidal Thoughts:  No  Homicidal Thoughts:  No  Memory:  Immediate;   Fair Recent;   Fair  Judgement:  Fair  Insight:  Shallow  Psychomotor Activity:  Decreased  Concentration:  Fair  Recall:  [employer] of Knowledge:Fair  Language: Fair  Akathisia:  Negative  Handed:  Right  AIMS (if indicated):    Assets:  Desire for Improvement  ADL's:  Intact  Cognition: WNL  Sleep:  poor     Allergies:   Allergies  Allergen Reactions  . Amantadine     Unsteady gait  . Duloxetine Other (See Comments)    Abdominal pain  . Milnacipran Nausea Only  . Nsaids Nausea Only     GI upse  . Pramipexole Other (See Comments)    Insomnia  . Primidone     Too sleepy  . Ropinirole Nausea Only  . Tolmetin Nausea Only        Current Medications: Current Outpatient Medications  Medication Sig Dispense Refill  . ciprofloxacin (CIPRO) 500 MG tablet     . gabapentin (NEURONTIN) 400 MG capsule TAKE ONE  CAPSULE BY MOUTH IN THE MORNING, AND 3 OR 4 AT BEDTIME  6  . NEUPRO 2 MG/24HR APPLY 1 PATCH DAILY AS DIRECTED.  4  . primidone (MYSOLINE) 50 MG tablet Take 50 mg by mouth.    . propranolol ER (INDERAL LA) 160 MG SR capsule Take 160 mg by mouth daily.  5  . simvastatin (ZOCOR) 20 MG tablet Take 20 mg by mouth daily.  3  . topiramate (TOPAMAX) 25 MG tablet Take by mouth.    . Levomilnacipran HCl ER 20 MG CP24 Take 20 mg by mouth daily. 30 capsule 0   No current facility-administered medications for this visit.       Treatment Plan Summary: Medication management and Plan as follows  1. STM:HDQQIWL, taper down paxil to half and stop  in 10 days. Start fetzima for depression and anxiety 20mg , will reassess in 3 weeks On topamax from neurology may help as mood stabilizer as well  2. GAD: fluctuates, lower paxil, continue gaba and add fetzima Back condition  And tremors are effecting mood..  3. Insomnia: takes ativan for sleep Concerns addressed. Fu with neurology . Reviewed meds Fu 3 w or earlier, work on distraction and keeping busy during the day    [employer] 2/26/202010:00 AM

## 2018-04-01 ENCOUNTER — Ambulatory Visit (INDEPENDENT_AMBULATORY_CARE_PROVIDER_SITE_OTHER): Payer: BLUE CROSS/BLUE SHIELD | Admitting: Psychiatry

## 2018-04-01 ENCOUNTER — Encounter (HOSPITAL_COMMUNITY): Payer: Self-pay | Admitting: Psychiatry

## 2018-04-01 ENCOUNTER — Other Ambulatory Visit: Payer: Self-pay

## 2018-04-01 VITALS — BP 122/80 | HR 66 | Ht 65.0 in | Wt 181.0 lb

## 2018-04-01 DIAGNOSIS — F331 Major depressive disorder, recurrent, moderate: Secondary | ICD-10-CM | POA: Diagnosis not present

## 2018-04-01 DIAGNOSIS — F411 Generalized anxiety disorder: Secondary | ICD-10-CM | POA: Diagnosis not present

## 2018-04-01 DIAGNOSIS — G4700 Insomnia, unspecified: Secondary | ICD-10-CM | POA: Diagnosis not present

## 2018-04-01 MED ORDER — LEVOMILNACIPRAN HCL ER 40 MG PO CP24
40.0000 mg | ORAL_CAPSULE | Freq: Every day | ORAL | 1 refills | Status: DC
Start: 2018-04-01 — End: 2018-04-20

## 2018-04-01 NOTE — Progress Notes (Signed)
Patient ID: Danielle Boyd, female   DOB: January 10, 1956, 63 y.o.   MRN: [id number]  [facility] Outpatient Visit  Patient Identification: Soren Pigman MRN:  [id number] Date of Evaluation:  04/01/2018 Referral Source: Self and Dr. Alton Revere (Neurology) Chief Complaint:   Chief Complaint    Follow-up; Other     Visit Diagnosis:  No diagnosis found. Diagnosis:   Patient Active Problem List   Diagnosis Date Noted  . Benign hypertension [I10] 04/04/2015  . Adult BMI 30+ [IMO0002] 03/27/2015  . Acid reflux [K21.9] 03/27/2015  . 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 Snores [R06.83] 03/06/2015  . Restless leg [G25.81] 03/06/2015  . Pins and needles sensation [R20.2] 03/06/2015  . Leg pain [M79.606] 03/06/2015  . Cannot sleep [G47.00] 03/06/2015  . 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 BP (high blood pressure) [I10] 03/06/2015  . HLD (hyperlipidemia) [E78.5] 03/06/2015  . Fatigue [R53.83] 03/06/2015  . Benign essential tremor [G25.0] 03/06/2015  . Clinical depression [F32.9] 03/06/2015  . 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 Leg paresthesia [R20.2] 03/06/2015  . Fibromyalgia [M79.7] 11/28/2014  . Low back pain [M54.5] 11/28/2014  . History of atypical nevus [Z87.898] 10/12/2012   History of Present Illness:  63  years old currently married Caucasian female initially referred by neurology and herself for management of depression, insomnia and anxiety.  Has seen neurology at [facility] started on Topamax next visit is clinically in 3 months possible evaluation for surgery for the tremors.  Patient wants to be on medication to avoid surgery   Last visit Paxil was tapered down and now started [facility] that has helped in the beginning but now she is feeling dysphoric again.  She is not taking the Topamax as  regularly sedated because some sedation she is back on some Klonopin takes it 2 times a day for anxiety but in general she feels dysphoric and withdraws from ithings  Subdued.   She also is on Inderal, gabapentin   Patient she feels fine but the tremors are still there at the outstretched arm   husband : supportive Depression subdued Severity of depression: subdued not hopeless  modifying Factor: grand daughter   Past Medical History:  Past Medical History:  Diagnosis Date  . Fatigue    History reviewed. No pertinent surgical history. Family History: History reviewed. No pertinent family history. Social History:   Social History   Socioeconomic History  . Marital status: Married    Spouse name: Not on file  . Number of children: Not on file  . Years of education: Not on file  . Highest education level: Not on file  Occupational History  . Not on file  Social Needs  . Financial resource strain: Not on file  . Food insecurity:    Worry: Not on file    Inability: Not on file  . Transportation needs:    Medical: Not on file    Non-medical: Not on file  Tobacco Use  . Smoking status: Never Smoker  . Smokeless tobacco: Never Used  Substance and Sexual Activity  . Alcohol use: Yes    Alcohol/week: 1.0 standard drinks    Types: 1 Cans of beer per week  . Drug use: No  . Sexual activity: Not Currently  Lifestyle  . Physical activity:    Days per week: Not on file    Minutes per  session: Not on file  . Stress: Not on file  Relationships  . Social connections:    Talks on phone: Not on file    Gets together: Not on file    Attends religious service: Not on file    Active member of club or organization: Not on file    Attends meetings of clubs or organizations: Not on file    Relationship status: Not on file  Other Topics Concern  . Not on file  Social History Narrative  . Not on file     Psychiatric Specialty Exam: Depression         Associated symptoms  include myalgias.  Associated symptoms include no suicidal ideas.   Review of Systems  Constitutional: Negative for fever.  Cardiovascular: Negative for palpitations.  Musculoskeletal: Positive for myalgias.  Skin: Negative for itching and rash.  Neurological: Positive for tremors. Negative for tingling.  Psychiatric/Behavioral: Positive for depression. Negative for substance abuse and suicidal ideas.    Blood pressure 122/80, pulse 66, height 5\' 5"  (1.651 m), weight 181 lb (82.1 kg).Body mass index is 30.12 kg/m.  General Appearance: Casual  Eye Contact:  Fair  Speech:  Slow  Volume:  Decreased  Mood: somewhat subdued  Affect:  congruent  Thought Process:  Coherent  Orientation:  Full (Time, Place, and Person)  Thought Content:  Rumination  Suicidal Thoughts:  No  Homicidal Thoughts:  No  Memory:  Immediate;   Fair Recent;   Fair  Judgement:  Fair  Insight:  Shallow  Psychomotor Activity:  Decreased  Concentration:  Fair  Recall:  [employer] of Knowledge:Fair  Language: Fair  Akathisia:  Negative  Handed:  Right  AIMS (if indicated):    Assets:  Desire for Improvement  ADL's:  Intact  Cognition: WNL  Sleep:  poor     Allergies:   Allergies  Allergen Reactions  . Amantadine     Unsteady gait  . Duloxetine Other (See Comments)    Abdominal pain  . Milnacipran Nausea Only  . Nsaids Nausea Only     GI upse  . Pramipexole Other (See Comments)    Insomnia  . Primidone     Too sleepy  . Ropinirole Nausea Only  . Tolmetin Nausea Only        Current Medications: Current Outpatient Medications  Medication Sig Dispense Refill  . ciprofloxacin (CIPRO) 500 MG tablet     . clonazePAM (KLONOPIN) 0.5 MG tablet TAKE 1 TABLET (0.5 MG TOTAL) BY MOUTH 3 (THREE) TIMES DAILY AS NEEDED FOR ANXIETY.    Marland Kitchen gabapentin (NEURONTIN) 400 MG capsule TAKE ONE CAPSULE BY MOUTH IN THE MORNING, AND 3 OR 4 AT BEDTIME  6  . NEUPRO 2 MG/24HR APPLY 1 PATCH DAILY AS DIRECTED.  4  .  primidone (MYSOLINE) 50 MG tablet Take 50 mg by mouth.    . propranolol ER (INDERAL LA) 160 MG SR capsule Take 160 mg by mouth daily.  5  . simvastatin (ZOCOR) 20 MG tablet Take 20 mg by mouth daily.  3  . topiramate (TOPAMAX) 25 MG tablet Take by mouth.    . Levomilnacipran HCl ER 40 MG CP24 Take 40 mg by mouth daily. 30 capsule 1   No current facility-administered medications for this visit.       Treatment Plan Summary: Medication management and Plan as follows  1. QKM:MNOTRRN,  Increase fetzima to 40mg . Continue gaba   On topamax from neurology may help as mood stabilizer as  well  2. GAD: fluctuates, increase fetzima 3. Insomnia:  Now on klonopine, cautioned it can cause dependence and depression  Concerns addressed. Fu with neurology . Reviewed meds Fu 6m or earlier if needed    Thresa Ross 3/19/202010:36 AM

## 2018-04-05 ENCOUNTER — Other Ambulatory Visit (HOSPITAL_COMMUNITY): Payer: Self-pay | Admitting: Psychiatry

## 2018-04-20 ENCOUNTER — Ambulatory Visit (INDEPENDENT_AMBULATORY_CARE_PROVIDER_SITE_OTHER): Payer: BLUE CROSS/BLUE SHIELD | Admitting: Psychiatry

## 2018-04-20 ENCOUNTER — Ambulatory Visit (HOSPITAL_COMMUNITY): Payer: BLUE CROSS/BLUE SHIELD | Admitting: Psychiatry

## 2018-04-20 ENCOUNTER — Encounter (HOSPITAL_COMMUNITY): Payer: Self-pay | Admitting: Psychiatry

## 2018-04-20 DIAGNOSIS — M797 Fibromyalgia: Secondary | ICD-10-CM | POA: Diagnosis not present

## 2018-04-20 DIAGNOSIS — F411 Generalized anxiety disorder: Secondary | ICD-10-CM | POA: Diagnosis not present

## 2018-04-20 DIAGNOSIS — F331 Major depressive disorder, recurrent, moderate: Secondary | ICD-10-CM

## 2018-04-20 DIAGNOSIS — G4700 Insomnia, unspecified: Secondary | ICD-10-CM | POA: Diagnosis not present

## 2018-04-20 MED ORDER — LEVOMILNACIPRAN HCL ER 40 MG PO CP24: 40.0000 mg | ORAL_CAPSULE | Freq: Every day | ORAL | 1 refills | Status: DC

## 2018-04-20 NOTE — Progress Notes (Signed)
Patient ID: Danielle Boyd, female   DOB: January 08, 1956, 63 y.o.   MRN: [id number]  [facility]   Patient Identification: Danielle Boyd MRN:  [id number] Date of Evaluation:  04/20/2018 Referral Source: Self and Dr. Alton RevereFeraru (Neurology) Chief Complaint:    Visit Diagnosis:    ICD-10-CM   1. 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 GAD (generalized anxiety disorder) F41.1   3. Insomnia, unspecified type G47.00   4. Fibromyalgia M79.7    Diagnosis:   Patient Active Problem List   Diagnosis Date Noted  . Benign hypertension [I10] 04/04/2015  . Adult BMI 30+ [IMO0002] 03/27/2015  . Acid reflux [K21.9] 03/27/2015  . 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 Snores [R06.83] 03/06/2015  . Restless leg [G25.81] 03/06/2015  . Pins and needles sensation [R20.2] 03/06/2015  . Leg pain [M79.606] 03/06/2015  . Cannot sleep [G47.00] 03/06/2015  . 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 BP (high blood pressure) [I10] 03/06/2015  . HLD (hyperlipidemia) [E78.5] 03/06/2015  . Fatigue [R53.83] 03/06/2015  . Benign essential tremor [G25.0] 03/06/2015  . Clinical depression [F32.9] 03/06/2015  . 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 Leg paresthesia [R20.2] 03/06/2015  . Fibromyalgia [M79.7] 11/28/2014  . Low back pain [M54.5] 11/28/2014  . History of atypical nevus [Z87.898] 10/12/2012   History of Present Illness:  63  years old currently married Caucasian female initially referred by neurology and herself for management of depression, insomnia and anxiety.  [facility] Top[facility] next visit is clinically in 3 months possible evaluation for surgery for the tremors.  Patient wants to be on medication to avoid surgery  I connected with  Danielle Boyd on 04/20/18 at  9:30 AM EDT by telephone and verified that I am speaking with the correct person using two identifiers.   I discussed the limitations, risks, security and privacy concerns of performing an evaluation and management service by telephone and the availability of in person appointments. I also discussed with the patient that there may be a patient responsible charge related to this service. The patient expressed understanding and agreed to proceed.  Last visit was having anxiety and subdued, fetzima was increased, some better now on 40mg  Still has tremors that bother her but some better  She is  on some Klonopin takes it 2 times a day for anxiety  She also is on Inderal, gabapentin     husband : supportive Depression subdued Severity of depression: subdued not hopeless  modifying Factor: grand daughter   Past Medical History:  Past Medical History:  Diagnosis Date  . Fatigue    History reviewed. No pertinent surgical history. Family History: History reviewed. No pertinent family history. Social History:   Social History   Socioeconomic History  . Marital status: Married    Spouse name: Not on file  . Number of children: Not on file  . Years of education: Not on file  . Highest education level: Not on file  Occupational History  . Not on file  Social Needs  . Financial resource strain: Not on file  . Food insecurity:    Worry: Not on file    Inability: Not on file  . Transportation needs:    Medical: Not on file    Non-medical: Not on file  Tobacco Use  .  Smoking status: Never Smoker  . Smokeless tobacco: Never Used  Substance and Sexual Activity  . Alcohol use: Yes    Alcohol/week: 1.0 standard drinks    Types: 1 Cans of beer per week  . Drug use: No  . Sexual activity: Not Currently  Lifestyle  . Physical activity:    Days per week: Not on file    Minutes per session: Not on file  . Stress: Not on file  Relationships  . Social  connections:    Talks on phone: Not on file    Gets together: Not on file    Attends religious service: Not on file    Active member of club or organization: Not on file    Attends meetings of clubs or organizations: Not on file    Relationship status: Not on file  Other Topics Concern  . Not on file  Social History Narrative  . Not on file     Psychiatric Specialty Exam: Depression         Associated symptoms include myalgias.  Associated symptoms include no suicidal ideas.   Review of Systems  Cardiovascular: Negative for chest pain.  Musculoskeletal: Positive for myalgias.  Skin: Negative for itching and rash.  Neurological: Positive for tremors. Negative for tingling.  Psychiatric/Behavioral: Negative for substance abuse and suicidal ideas.    There were no vitals taken for this visit.There is no height or weight on file to calculate BMI.  General Appearance:  Eye Contact:   Speech:  Slow  Volume:  Decreased  Mood: some better  Affect:  congruent  Thought Process:  Coherent  Orientation:  Full (Time, Place, and Person)  Thought Content:  Rumination  Suicidal Thoughts:  No  Homicidal Thoughts:  No  Memory:  Immediate;   Fair Recent;   Fair  Judgement:  Fair  Insight:  Shallow  Psychomotor Activity:  Decreased  Concentration:  Fair  Recall:  [employer] of Knowledge:Fair  Language: Fair  Akathisia:  Negative  Handed:  Right  AIMS (if indicated):    Assets:  Desire for Improvement  ADL's:  Intact  Cognition: WNL  Sleep:  poor     Allergies:   Allergies  Allergen Reactions  . Amantadine     Unsteady gait  . Duloxetine Other (See Comments)    Abdominal pain  . Milnacipran Nausea Only  . Nsaids Nausea Only     GI upse  . Pramipexole Other (See Comments)    Insomnia  . Primidone     Too sleepy  . Ropinirole Nausea Only  . Tolmetin Nausea Only        Current Medications: Current Outpatient Medications  Medication Sig Dispense Refill  .  ciprofloxacin (CIPRO) 500 MG tablet     . clonazePAM (KLONOPIN) 0.5 MG tablet TAKE 1 TABLET (0.5 MG TOTAL) BY MOUTH 3 (THREE) TIMES DAILY AS NEEDED FOR ANXIETY.    Marland Kitchen gabapentin (NEURONTIN) 400 MG capsule TAKE ONE CAPSULE BY MOUTH IN THE MORNING, AND 3 OR 4 AT BEDTIME  6  . Levomilnacipran HCl ER 40 MG CP24 Take 40 mg by mouth daily. 30 capsule 1  . NEUPRO 2 MG/24HR APPLY 1 PATCH DAILY AS DIRECTED.  4  . primidone (MYSOLINE) 50 MG tablet Take 50 mg by mouth.    . propranolol ER (INDERAL LA) 160 MG SR capsule Take 160 mg by mouth daily.  5  . simvastatin (ZOCOR) 20 MG tablet Take 20 mg by mouth daily.  3  .  topiramate (TOPAMAX) 25 MG tablet Take by mouth.     No current facility-administered medications for this visit.       Treatment Plan Summary: Medication management and Plan as follows  1. WGN:FAOZ better, continue fetzima   On topamax from neurology may help as mood stabilizer as well  2. GAD: some better, continue fetima, avoid excessive klonopine  3. Insomnia:  Now on klonopine, cautioned it can cause dependence and depression  I discussed the assessment and treatment plan with the patient. The patient was provided an opportunity to ask questions and all were answered. The patient agreed with the plan and demonstrated an understanding of the instructions.   The patient was advised to call back or seek an in-person evaluation if the symptoms worsen or if the condition fails to improve as anticipated.  I provided 15 minutes of non-face-to-face time during this encounter. Fu 6 w     Thresa Ross 4/7/20209:36 AM

## 2018-05-04 ENCOUNTER — Telehealth (HOSPITAL_COMMUNITY): Payer: Self-pay

## 2018-05-04 NOTE — Telephone Encounter (Signed)
Patient called stating medication is making her think of ways to kill herself. I called 911 so they can go out and check on her while I had her on the phone. She was crying and hysterical. 911 dispatched someone out to her.

## 2018-05-04 NOTE — Telephone Encounter (Signed)
She was doing fair and meds were helping last visit.  You rightfully called 911 considering her decompensation  Follow up by end of day

## 2018-05-05 ENCOUNTER — Telehealth (HOSPITAL_COMMUNITY): Payer: Self-pay | Admitting: Psychiatry

## 2018-05-05 NOTE — Telephone Encounter (Signed)
I have connected with the provider at inpatient. He will stop fetzima and start paxil

## 2018-05-05 NOTE — Telephone Encounter (Signed)
Dr Loann Quill at [facility] inpatient would like to speak to you in rearguards to this patient. Please call back at [phone]

## 2018-05-12 ENCOUNTER — Encounter (HOSPITAL_COMMUNITY): Payer: Self-pay | Admitting: Psychiatry

## 2018-05-12 ENCOUNTER — Ambulatory Visit (INDEPENDENT_AMBULATORY_CARE_PROVIDER_SITE_OTHER): Payer: BLUE CROSS/BLUE SHIELD | Admitting: Psychiatry

## 2018-05-12 ENCOUNTER — Other Ambulatory Visit: Payer: Self-pay

## 2018-05-12 DIAGNOSIS — M797 Fibromyalgia: Secondary | ICD-10-CM

## 2018-05-12 DIAGNOSIS — G4700 Insomnia, unspecified: Secondary | ICD-10-CM | POA: Diagnosis not present

## 2018-05-12 DIAGNOSIS — F331 Major depressive disorder, recurrent, moderate: Secondary | ICD-10-CM

## 2018-05-12 DIAGNOSIS — F411 Generalized anxiety disorder: Secondary | ICD-10-CM

## 2018-05-12 MED ORDER — PAROXETINE HCL 40 MG PO TABS: 40.0000 mg | ORAL_TABLET | Freq: Every day | ORAL | 0 refills | Status: DC

## 2018-05-12 NOTE — Progress Notes (Signed)
Patient ID: Danielle Boyd, female   DOB: [birth year], 63 y.o.   MRN: [id number]  [facility] Outpatient Visit Tele psych   Patient Identification: Danielle Boyd MRN:  [id number] Date of Evaluation:  05/12/2018 Referral Source: Self and Dr. Alton Revere (Neurology) Chief Complaint:    Visit Diagnosis:  No diagnosis found. Diagnosis:   Patient Active Problem List   Diagnosis Date Noted  . Benign hypertension [I10] 04/04/2015  . Adult BMI 30+ [IMO0002] 03/27/2015  . Acid reflux [K21.9] 03/27/2015  . 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 Snores [R06.83] 03/06/2015  . Restless leg [G25.81] 03/06/2015  . Pins and needles sensation [R20.2] 03/06/2015  . Leg pain [M79.606] 03/06/2015  . Cannot sleep [G47.00] 03/06/2015  . 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 BP (high blood pressure) [I10] 03/06/2015  . HLD (hyperlipidemia) [E78.5] 03/06/2015  . Fatigue [R53.83] 03/06/2015  . Benign essential tremor [G25.0] 03/06/2015  . Clinical depression [F32.9] 03/06/2015  . 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 Leg paresthesia [R20.2] 03/06/2015  . Fibromyalgia [M79.7] 11/28/2014  . Low back pain [M54.5] 11/28/2014  . History of atypical nevus [Z87.898] 10/12/2012   History of Present Illness:  63  years old currently married Caucasian female initially referred by neurology and herself for management of depression, insomnia and anxiety.  Has seen neurology at [facility] started on Topamax next visit is clinically in 3 months possible evaluation for surgery for the tremors.  Patient wants to be on medication to avoid surgery  I connected with Danielle Boyd on  05/12/18 at  10:28 AM EDT by telephone and verified that I am speaking with the correct person using two identifiers.   I discussed the limitations, risks, security  and privacy concerns of performing an evaluation and management service by telephone and the availability of in person appointments. I also discussed with the patient that there may be a patient responsible charge related to this service. The patient expressed understanding and agreed to proceed.  Last visit was doing fair on fetzima. Later called about depression and suicidal toughts, didn't have any new stressor felt maybe due to medication. Not sure. Staff engaged with her in talk and called 911.  In hospital at [facility] for few days. Dr connected with me and changed fetzima to paxil, doing fair. Was discharged Doing fair feels less anxious and depression is better  she is on klonopine during day  and ativan at night by primary care, I cautioned it can cause forgetfullness and depression. She feels it helps anxiety and wants to continue understands the risk  She also is on Inderal, gabapentin for tremor and myalgia  No side effects, feels paxil doing well.   husband : supportive Depression better Severity of depression: subdued not hopeless  modifying Factor: grand daughter   Past Medical History:  Past Medical History:  Diagnosis Date  . Fatigue    History reviewed. No pertinent surgical history. Family History: History reviewed. No pertinent family history. Social History:   Social History   Socioeconomic History  . Marital status: Married    Spouse name: Not on file  . Number of children: Not on file  . Years of education: Not on file  . Highest education level: Not on file  Occupational History  . Not on file  Social Needs  . Financial resource strain: Not on file  .  Food insecurity:    Worry: Not on file    Inability: Not on file  . Transportation needs:    Medical: Not on file    Non-medical: Not on file  Tobacco Use  . Smoking status: Never Smoker  . Smokeless tobacco: Never Used  Substance and Sexual Activity  . Alcohol use: Yes    Alcohol/week: 1.0  standard drinks    Types: 1 Cans of beer per week  . Drug use: No  . Sexual activity: Not Currently  Lifestyle  . Physical activity:    Days per week: Not on file    Minutes per session: Not on file  . Stress: Not on file  Relationships  . Social connections:    Talks on phone: Not on file    Gets together: Not on file    Attends religious service: Not on file    Active member of club or organization: Not on file    Attends meetings of clubs or organizations: Not on file    Relationship status: Not on file  Other Topics Concern  . Not on file  Social History Narrative  . Not on file     Psychiatric Specialty Exam: Depression         Associated symptoms include myalgias.  Associated symptoms include no suicidal ideas.   Review of Systems  Cardiovascular: Negative for chest pain.  Musculoskeletal: Positive for myalgias.  Skin: Negative for itching and rash.  Psychiatric/Behavioral: Negative for depression, substance abuse and suicidal ideas.    There were no vitals taken for this visit.There is no height or weight on file to calculate BMI.  General Appearance:  Eye Contact:   Speech:  Slow  Volume:  Decreased  Mood: fair  Affect:  congruent  Thought Process:  Coherent  Orientation:  Full (Time, Place, and Person)  Thought Content:  Rumination  Suicidal Thoughts:  No  Homicidal Thoughts:  No  Memory:  Immediate;   Fair Recent;   Fair  Judgement:  Fair  Insight:  Shallow  Psychomotor Activity:  Decreased  Concentration:  Fair  Recall:  [employer] of Knowledge:Fair  Language: Fair  Akathisia:  Negative  Handed:  Right  AIMS (if indicated):    Assets:  Desire for Improvement  ADL's:  Intact  Cognition: WNL  Sleep:  poor     Allergies:   Allergies  Allergen Reactions  . Amantadine     Unsteady gait  . Duloxetine Other (See Comments)    Abdominal pain  . Milnacipran Nausea Only  . Nsaids Nausea Only     GI upse  . Pramipexole Other (See Comments)     Insomnia  . Primidone     Too sleepy  . Ropinirole Nausea Only  . Tolmetin Nausea Only        Current Medications: Current Outpatient Medications  Medication Sig Dispense Refill  . ciprofloxacin (CIPRO) 500 MG tablet     . clonazePAM (KLONOPIN) 0.5 MG tablet TAKE 1 TABLET (0.5 MG TOTAL) BY MOUTH 3 (THREE) TIMES DAILY AS NEEDED FOR ANXIETY.    Marland Kitchen gabapentin (NEURONTIN) 400 MG capsule TAKE ONE CAPSULE BY MOUTH IN THE MORNING, AND 3 OR 4 AT BEDTIME  6  . NEUPRO 2 MG/24HR APPLY 1 PATCH DAILY AS DIRECTED.  4  . PARoxetine (PAXIL) 40 MG tablet Take 1 tablet (40 mg total) by mouth daily. 30 tablet 0  . primidone (MYSOLINE) 50 MG tablet Take 50 mg by mouth.    Marland Kitchen  propranolol ER (INDERAL LA) 160 MG SR capsule Take 160 mg by mouth daily.  5  . simvastatin (ZOCOR) 20 MG tablet Take 20 mg by mouth daily.  3  . topiramate (TOPAMAX) 25 MG tablet Take by mouth.     No current facility-administered medications for this visit.       Treatment Plan Summary: Medication management and Plan as follows  1. MDD: doing fair on paxil. Will continue has meds for now  On topamax from neurology may help as mood stabilizer as well Also on gabapentin 2. GAD: doing fair. On klonopine from primary care   3. Insomnia:  Now on klonopine, cautioned it can cause dependence and depression.at night , understands the risk, discussed above  I discussed the assessment and treatment plan with the patient. The patient was provided an opportunity to ask questions and all were answered. The patient agreed with the plan and demonstrated an understanding of the instructions.   The patient was advised to call back or seek an in-person evaluation if the symptoms worsen or if the condition fails to improve as anticipated.  I provided 15 minutes of non-face-to-face time during this encounter. Fu 4 w or earlier if needed. Stable as of now    [employer] Mairin Lindsley 4/29/202010:36 AM

## 2018-06-02 ENCOUNTER — Other Ambulatory Visit (HOSPITAL_COMMUNITY): Payer: Self-pay

## 2018-06-02 MED ORDER — PAROXETINE HCL 40 MG PO TABS: 40.0000 mg | ORAL_TABLET | Freq: Every day | ORAL | 0 refills | Status: DC

## 2018-06-02 MED ORDER — PAROXETINE HCL 40 MG PO TABS: 40.0000 mg | ORAL_TABLET | Freq: Every day | ORAL | 0 refills | Status: AC

## 2018-06-22 ENCOUNTER — Ambulatory Visit (INDEPENDENT_AMBULATORY_CARE_PROVIDER_SITE_OTHER): Payer: BC Managed Care – PPO | Admitting: Psychiatry

## 2018-06-22 ENCOUNTER — Encounter (HOSPITAL_COMMUNITY): Payer: Self-pay | Admitting: Psychiatry

## 2018-06-22 DIAGNOSIS — M797 Fibromyalgia: Secondary | ICD-10-CM | POA: Diagnosis not present

## 2018-06-22 DIAGNOSIS — F411 Generalized anxiety disorder: Secondary | ICD-10-CM | POA: Diagnosis not present

## 2018-06-22 DIAGNOSIS — F331 Major depressive disorder, recurrent, moderate: Secondary | ICD-10-CM

## 2018-06-22 DIAGNOSIS — G4700 Insomnia, unspecified: Secondary | ICD-10-CM

## 2018-06-22 NOTE — Progress Notes (Signed)
Patient ID: Danielle Boyd, female   DOB: 1955-12-07, 63 y.o.   MRN: [id number]  [facility]   Patient Identification: Danielle Boyd MRN:  [id number] Date of Evaluation:  06/22/2018 Referral Source: Self and Dr. Alton RevereFeraru (Neurology) Chief Complaint:   depression follow up Visit Diagnosis:    ICD-10-CM   1. 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 GAD (generalized anxiety disorder) F41.1   3. Insomnia, unspecified type G47.00   4. Fibromyalgia M79.7    Diagnosis:   Patient Active Problem List   Diagnosis Date Noted  . Benign hypertension [I10] 04/04/2015  . Adult BMI 30+ [IMO0002] 03/27/2015  . Acid reflux [K21.9] 03/27/2015  . 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 Snores [R06.83] 03/06/2015  . Restless leg [G25.81] 03/06/2015  . Pins and needles sensation [R20.2] 03/06/2015  . Leg pain [M79.606] 03/06/2015  . Cannot sleep [G47.00] 03/06/2015  . 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 BP (high blood pressure) [I10] 03/06/2015  . HLD (hyperlipidemia) [E78.5] 03/06/2015  . Fatigue [R53.83] 03/06/2015  . Benign essential tremor [G25.0] 03/06/2015  . Clinical depression [F32.9] 03/06/2015  . 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 Leg paresthesia [R20.2] 03/06/2015  . Fibromyalgia [M79.7] 11/28/2014  . Low back pain [M54.5] 11/28/2014  . History of atypical nevus [Z87.898] 10/12/2012   History of Present Illness:  63  years old currently married Caucasian female initially referred by neurology and herself for management of depression, insomnia and anxiety.  [facility]  [facility] for surgery for the tremors.  Patient wants to be on medication to avoid surgery  I connected with Danielle Boyd on 06/22/18 at 10:00  AM EDT by telephone and verified that I am speaking with the correct person using two identifiers.   I discussed the limitations, risks, security and privacy concerns of performing an evaluation and management service by telephone and the availability of in person appointments. I also discussed with the patient that there may be a patient responsible charge related to this service. The patient expressed understanding and agreed to proceed.  Not taking topomax, still has tremors, planning Neuro surgery for tremors next year paxil helping depression and anxiety   she is on klonopine during day  and ativan at night by primary care, I cautioned it can cause forgetfullness and depression. She feels it helps anxiety and wants to continue understands the risk  She also is on Inderal, gabapentin for tremor and myalgia  No side effects, feels paxil doing well.   husband : supportive Depression better Severity of depression: subdued not hopeless  modifying Factor: grand daughter   Past Medical History:  Past Medical History:  Diagnosis Date  . Fatigue    History reviewed. No pertinent surgical history. Family History: History reviewed. No pertinent family history. Social History:   Social History   Socioeconomic History  . Marital status: Married    Spouse name: Not on file  . Number of children: Not on file  . Years of education: Not on file  . Highest education level: Not on file  Occupational History  . Not on file  Social Needs  . Financial resource strain: Not on file  . Food insecurity:    Worry: Not on file    Inability: Not on file  . Transportation  needs:    Medical: Not on file    Non-medical: Not on file  Tobacco Use  . Smoking status: Never Smoker  . Smokeless tobacco: Never Used  Substance and Sexual Activity  . Alcohol use: Yes    Alcohol/week: 1.0 standard drinks    Types: 1 Cans of beer per week  . Drug use: No  . Sexual activity: Not Currently  Lifestyle  .  Physical activity:    Days per week: Not on file    Minutes per session: Not on file  . Stress: Not on file  Relationships  . Social connections:    Talks on phone: Not on file    Gets together: Not on file    Attends religious service: Not on file    Active member of club or organization: Not on file    Attends meetings of clubs or organizations: Not on file    Relationship status: Not on file  Other Topics Concern  . Not on file  Social History Narrative  . Not on file     Psychiatric Specialty Exam: Depression         Associated symptoms include myalgias.  Associated symptoms include no suicidal ideas.   Review of Systems  Cardiovascular: Negative for chest pain.  Musculoskeletal: Positive for myalgias.  Skin: Negative for itching and rash.  Neurological: Positive for tremors.  Psychiatric/Behavioral: Negative for depression, substance abuse and suicidal ideas.    There were no vitals taken for this visit.There is no height or weight on file to calculate BMI.  General Appearance:  Eye Contact:   Speech:  Slow  Volume:  Decreased  Mood: fair  Affect:  congruent  Thought Process:  Coherent  Orientation:  Full (Time, Place, and Person)  Thought Content:  Rumination  Suicidal Thoughts:  No  Homicidal Thoughts:  No  Memory:  Immediate;   Fair Recent;   Fair  Judgement:  Fair  Insight:  Shallow  Psychomotor Activity:  Decreased  Concentration:  Fair  Recall:  [employer]  Fund of Knowledge:Fair  Language: Fair  Akathisia:  Negative  Handed:  Right  AIMS (if indicated):    Assets:  Desire for Improvement  ADL's:  Intact  Cognition: WNL  Sleep:  poor     Allergies:   Allergies  Allergen Reactions  . Amantadine     Unsteady gait  . Duloxetine Other (See Comments)    Abdominal pain  . Milnacipran Nausea Only  . Nsaids Nausea Only     GI upse  . Pramipexole Other (See Comments)    Insomnia  . Primidone     Too sleepy  . Ropinirole Nausea Only  . Tolmetin  Nausea Only        Current Medications: Current Outpatient Medications  Medication Sig Dispense Refill  . ciprofloxacin (CIPRO) 500 MG tablet     . clonazePAM (KLONOPIN) 0.5 MG tablet TAKE 1 TABLET (0.5 MG TOTAL) BY MOUTH 3 (THREE) TIMES DAILY AS NEEDED FOR ANXIETY.    Marland Kitchen. gabapentin (NEURONTIN) 400 MG capsule TAKE ONE CAPSULE BY MOUTH IN THE MORNING, AND 3 OR 4 AT BEDTIME  6  . NEUPRO 2 MG/24HR APPLY 1 PATCH DAILY AS DIRECTED.  4  . PARoxetine (PAXIL) 40 MG tablet Take 1 tablet (40 mg total) by mouth daily. 90 tablet 0  . primidone (MYSOLINE) 50 MG tablet Take 50 mg by mouth.    . propranolol ER (INDERAL LA) 160 MG SR capsule Take 160 mg by mouth  daily.  5  . simvastatin (ZOCOR) 20 MG tablet Take 20 mg by mouth daily.  3  . topiramate (TOPAMAX) 25 MG tablet Take by mouth.     No current facility-administered medications for this visit.       Treatment Plan Summary: Medication management and Plan as follows  1. MDD: doing fair, continue paxil  On gaba from primary care 2. GAD: doing fair. On klonopine from primary care   3. Insomnia:  Now on klonopine, cautioned it can cause dependence and depression.at night , understands the risk, discussed above  I discussed the assessment and treatment plan with the patient. The patient was provided an opportunity to ask questions and all were answered. The patient agreed with the plan and demonstrated an understanding of the instructions.   The patient was advised to call back or seek an in-person evaluation if the symptoms worsen or if the condition fails to improve as anticipated.  I provided 15 minutes of non-face-to-face time during this encounter. Fu 6w . She wants to coe in 2m. Says meds for depression and anxiety are working    [employer] 6/9/202010:12 AM

## 2018-09-22 ENCOUNTER — Ambulatory Visit (HOSPITAL_COMMUNITY): Payer: BC Managed Care – PPO | Admitting: Psychiatry

## 2022-12-08 ENCOUNTER — Telehealth: Payer: Self-pay

## 2022-12-08 NOTE — Telephone Encounter (Signed)
Requesting: Atorvastatin 10mg  Last Visit: Visit date not found Next Visit: Visit date not found Last Refill: 01/02/2022  Please Advise

## 2022-12-08 NOTE — Telephone Encounter (Signed)
Patient is from my prior office, [facility].  I have not seen her at this clinic. No refill, she will need to get with her new PCP or at [facility] if she is still a patient there.

## 2022-12-09 NOTE — Telephone Encounter (Signed)
I called and spoke with patient and she said that she is not a patient at previous office  and unsure who last filled her medication and also unsure of her PCP's name. I advised patient to contact [facility] to check who other than Morrie Sheldon prescribed current medication request.
